# Patient Record
Sex: Male | Born: 1960 | Race: Black or African American | Hispanic: No | Marital: Married | Smoking: Never smoker
Health system: Southern US, Community
[De-identification: ages and names within clinical notes are randomized; demographics above are authoritative.]

## PROBLEM LIST (undated history)

## (undated) DIAGNOSIS — I5022 Chronic systolic (congestive) heart failure: Secondary | ICD-10-CM

## (undated) DIAGNOSIS — J45909 Unspecified asthma, uncomplicated: Secondary | ICD-10-CM

## (undated) DIAGNOSIS — E785 Hyperlipidemia, unspecified: Secondary | ICD-10-CM

## (undated) DIAGNOSIS — I10 Essential (primary) hypertension: Secondary | ICD-10-CM

## (undated) DIAGNOSIS — I428 Other cardiomyopathies: Secondary | ICD-10-CM

## (undated) HISTORY — DX: Essential (primary) hypertension: I10

## (undated) HISTORY — DX: Other cardiomyopathies: I42.8

---
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
## 2010-11-30 ENCOUNTER — Emergency Department (HOSPITAL_COMMUNITY): Payer: 59

## 2010-11-30 ENCOUNTER — Inpatient Hospital Stay (HOSPITAL_COMMUNITY)
Admission: EM | Admit: 2010-11-30 | Discharge: 2010-12-04 | DRG: 292 | Disposition: A | Discharge status: Home / Self Care | Payer: 59 | Attending: Cardiology | Admitting: Cardiology

## 2010-11-30 DIAGNOSIS — I428 Other cardiomyopathies: Secondary | ICD-10-CM | POA: Diagnosis present

## 2010-11-30 DIAGNOSIS — I5021 Acute systolic (congestive) heart failure: Principal | ICD-10-CM | POA: Diagnosis present

## 2010-11-30 DIAGNOSIS — J45909 Unspecified asthma, uncomplicated: Secondary | ICD-10-CM | POA: Diagnosis present

## 2010-11-30 DIAGNOSIS — I959 Hypotension, unspecified: Secondary | ICD-10-CM | POA: Diagnosis present

## 2010-11-30 DIAGNOSIS — N289 Disorder of kidney and ureter, unspecified: Secondary | ICD-10-CM | POA: Diagnosis present

## 2010-11-30 DIAGNOSIS — I509 Heart failure, unspecified: Secondary | ICD-10-CM | POA: Diagnosis present

## 2010-11-30 DIAGNOSIS — R0602 Shortness of breath: Secondary | ICD-10-CM

## 2010-11-30 DIAGNOSIS — I498 Other specified cardiac arrhythmias: Secondary | ICD-10-CM | POA: Diagnosis present

## 2010-11-30 LAB — URINALYSIS, ROUTINE W REFLEX MICROSCOPIC
Bilirubin Urine: NEGATIVE
Hgb urine dipstick: NEGATIVE
Protein, ur: NEGATIVE mg/dL
Urobilinogen, UA: 1 mg/dL (ref 0.0–1.0)

## 2010-11-30 LAB — CBC
MCH: 28.5 pg (ref 26.0–34.0)
MCHC: 33.7 g/dL (ref 30.0–36.0)
Platelets: 197 10*3/uL (ref 150–400)

## 2010-11-30 LAB — COMPREHENSIVE METABOLIC PANEL
ALT: 26 U/L (ref 0–53)
CO2: 24 mEq/L (ref 19–32)
Calcium: 9.6 mg/dL (ref 8.4–10.5)
Creatinine, Ser: 1.26 mg/dL (ref 0.50–1.35)
GFR calc Af Amer: >60 mL/min (ref >60)
GFR calc non Af Amer: >60 mL/min (ref >60)
Glucose, Bld: 102 mg/dL — ABNORMAL HIGH (ref 70–99)

## 2010-11-30 LAB — DIFFERENTIAL
Basophils Relative: 0 % (ref 0–1)
Eosinophils Absolute: 0 10*3/uL (ref 0.0–0.7)
Monocytes Absolute: 0.4 10*3/uL (ref 0.1–1.0)
Monocytes Relative: 6 % (ref 3–12)
Neutrophils Relative %: 72 % (ref 43–77)

## 2010-11-30 LAB — POCT I-STAT TROPONIN I: Troponin i, poc: 0.04 ng/mL (ref 0.00–0.08)

## 2010-11-30 MED ORDER — IOHEXOL 300 MG/ML  SOLN
100.0000 mL | Freq: Once | INTRAMUSCULAR | Status: AC | PRN
  Administered 2010-11-30: 100 mL via INTRAVENOUS

## 2010-12-01 LAB — CK TOTAL AND CKMB (NOT AT ARMC)
CK, MB: 5.5 ng/mL — ABNORMAL HIGH (ref 0.3–4.0)
Total CK: 305 U/L — ABNORMAL HIGH (ref 7–232)

## 2010-12-01 LAB — HEMOGLOBIN A1C
Hgb A1c MFr Bld: 5.2 % (ref <5.7)
Mean Plasma Glucose: 103 mg/dL (ref <117)

## 2010-12-01 LAB — LIPID PANEL
Cholesterol: 205 mg/dL — ABNORMAL HIGH (ref 0–200)
LDL Cholesterol: 145 mg/dL — ABNORMAL HIGH (ref 0–99)
VLDL: 27 mg/dL (ref 0–40)

## 2010-12-01 LAB — CARDIAC PANEL(CRET KIN+CKTOT+MB+TROPI)
CK, MB: 4.6 ng/mL — ABNORMAL HIGH (ref 0.3–4.0)
Relative Index: 1.6 (ref 0.0–2.5)
Total CK: 245 U/L — ABNORMAL HIGH (ref 7–232)
Troponin I: <0.3 ng/mL (ref <0.30)

## 2010-12-01 LAB — CBC
HCT: 41.5 % (ref 39.0–52.0)
MCH: 27.8 pg (ref 26.0–34.0)
MCV: 84.7 fL (ref 78.0–100.0)
Platelets: 181 10*3/uL (ref 150–400)
RBC: 4.9 MIL/uL (ref 4.22–5.81)

## 2010-12-01 LAB — BASIC METABOLIC PANEL
BUN: 17 mg/dL (ref 6–23)
CO2: 25 mEq/L (ref 19–32)
Calcium: 9.5 mg/dL (ref 8.4–10.5)
Creatinine, Ser: 1.39 mg/dL — ABNORMAL HIGH (ref 0.50–1.35)

## 2010-12-02 DIAGNOSIS — I5021 Acute systolic (congestive) heart failure: Secondary | ICD-10-CM

## 2010-12-02 LAB — BASIC METABOLIC PANEL
Chloride: 104 mEq/L (ref 96–112)
GFR calc Af Amer: 54 mL/min — ABNORMAL LOW (ref >60)
GFR calc non Af Amer: 44 mL/min — ABNORMAL LOW (ref >60)
Potassium: 3.5 mEq/L (ref 3.5–5.1)
Sodium: 143 mEq/L (ref 135–145)

## 2010-12-02 LAB — MAGNESIUM: Magnesium: 2.3 mg/dL (ref 1.5–2.5)

## 2010-12-03 ENCOUNTER — Inpatient Hospital Stay (HOSPITAL_COMMUNITY): Payer: 59

## 2010-12-03 DIAGNOSIS — I519 Heart disease, unspecified: Secondary | ICD-10-CM

## 2010-12-03 LAB — BASIC METABOLIC PANEL
Calcium: 9.4 mg/dL (ref 8.4–10.5)
GFR calc Af Amer: 59 mL/min — ABNORMAL LOW (ref >60)
GFR calc non Af Amer: 48 mL/min — ABNORMAL LOW (ref >60)
Potassium: 4.1 mEq/L (ref 3.5–5.1)
Sodium: 142 mEq/L (ref 135–145)

## 2010-12-04 DIAGNOSIS — I519 Heart disease, unspecified: Secondary | ICD-10-CM

## 2010-12-04 LAB — BASIC METABOLIC PANEL
BUN: 24 mg/dL — ABNORMAL HIGH (ref 6–23)
CO2: 27 mEq/L (ref 19–32)
Chloride: 107 mEq/L (ref 96–112)
Creatinine, Ser: 1.6 mg/dL — ABNORMAL HIGH (ref 0.50–1.35)
Glucose, Bld: 93 mg/dL (ref 70–99)

## 2010-12-04 LAB — HIV ANTIBODY (ROUTINE TESTING W REFLEX): HIV: NONREACTIVE

## 2010-12-04 LAB — IRON AND TIBC
Iron: 130 ug/dL (ref 42–135)
UIBC: 148 ug/dL (ref 125–400)

## 2010-12-06 LAB — PROTEIN ELECTROPHORESIS, SERUM
Alpha-1-Globulin: 5.1 % — ABNORMAL HIGH (ref 2.9–4.9)
Gamma Globulin: 12.3 % (ref 11.1–18.8)
Total Protein ELP: 6.8 g/dL (ref 6.0–8.3)

## 2010-12-10 ENCOUNTER — Ambulatory Visit (INDEPENDENT_AMBULATORY_CARE_PROVIDER_SITE_OTHER): Payer: 59 | Admitting: *Deleted

## 2010-12-10 DIAGNOSIS — I5022 Chronic systolic (congestive) heart failure: Secondary | ICD-10-CM

## 2010-12-10 DIAGNOSIS — R079 Chest pain, unspecified: Secondary | ICD-10-CM

## 2010-12-10 NOTE — Discharge Summary (Addendum)
NAMEDRESHAUN, STENE NO.:  [id number]  MEDICAL RECORD NO.:  [id number]  LOCATION:  4708                         FACILITY:  [facility]  PHYSICIAN:  Marca Ancona, MD      DATE OF BIRTH:  11-01-1960  DATE OF ADMISSION:  11/30/2010 DATE OF DISCHARGE:  12/04/2010                              DISCHARGE SUMMARY   DISCHARGE DIAGNOSES: 1. Newly diagnosed acute systolic heart failure secondary to     cardiomyopathy of uncertain etiology.     a.     Ejection fraction of 15% by echo December 03, 2010. B  Discharge weight of 81.1 kg. 1. Acute renal insufficiency, discharge creatinine 1.60.     a.     Outpatient BMET next week. 2. Borderline low blood pressure.  HOSPITAL COURSE:  Mr. Sturdevant is a 50 year old gentleman with no significant past medical history who was admitted with exertional dyspnea, for approximately 2 weeks with easy fatigability.  He was helping move some furniture on the day of admission and became extremely short of breath and had to sit down.  He presented to Urgent Care for further evaluation.  He denied any orthopnea or PND, but did endorse a weight gain of approximately 3-4 pounds with chronic cough on and off for few months.  In the Urgent Care, he was noted to have elevated BMP and chest x-ray was consistent with pulmonary edema.  D-dimer was also elevated, so a CT angiogram chest was obtained, which demonstrated no pulmonary embolism, but was consistent with CHF with bilateral small effusions and some edema.  His heart was enlarged.  His cardiac enlargement and imaging was concerning that the patient had acute systolic congestive heart failure, with no particular cause that was certain.  The patient did not have a history of uncontrolled blood pressure and did not drink or use drugs.  He has not had any chest pain. Dr. Shirlee Latch stated that he cannot rule out myocarditis as a possible cause and coronary artery disease was also a possibility.  His  EKG could be consistent with an old anteroseptal MI.  He was started on aspirin and initiated on 40 mg of IV Lasix b.i.d. with afterload reduction with an ACE inhibitor.  Cardiac enzymes were cycled, which did demonstrate some elevation in MB of 5.5 and CK of 305 and negative relative indices and negative troponins x3.  With diuresis, the patient improved. However, his creatinine went from 1.26 up to a peak 1.65.  Lasix was backed down and on the day of discharge, it was 1.60.  A 2-D echocardiogram was obtained this admission demonstrating a severely decreased EF of 15% with akinesis of inferior, lateral, and anterior septal myocardium defect.  There is moderate to severe hypokinesis of the septum in the anterolateral segments.  Possibility of a new thrombus versus coarse trabeculations.  Because of concern for LV thrombus, a limited 2-D echocardiogram was obtained and read by Dr. Myrtis Ser this afternoon and reviewed with Dr. Shirlee Latch, which showed no obvious apical LV clot.  The images on previous echocardiogram were felt consistent with trabecula.  On day of discharge, the patient is feeling better.  He has been initiated on a regimen  of ACE inhibitor, beta blockade, aspirin, Lasix, and potassium.  Dr. Shirlee Latch has seen and examined him today and feels he is stable for discharge.  The patient also had brief 12 beats of nonsustained VT this admission. It was felt that he will require cardiac catheterization at some point, however this will be deferred until his renal function improved given recent increase with diuresis in his creatinine.  DISCHARGE LABS:  WBC 6.9, hemoglobin 13.6, hematocrit 41.5, platelet count 181.  D-dimer 1.17.  Sodium 143, potassium 3.5, chloride 104, CO2 of 29, glucose 94, BUN 21, creatinine 1.5.  LFTs were normal on August 31.  Peak CK was 305, peak MB was 5.5, negative troponins x3.  BNP on admit was 925, at discharge 218.  Total cholesterol of 205, triglycerides  135, HDL 33, LDL 145.  TSH was 2.214.  UA was negative.  STUDIES: 1. Chest x-ray December 03, 2010, showed improving congestive heart     failure without complete resolution. 2. CTA angio of the chest November 30, 2010, showed no evidence of     significant PE.  Past symptomatic congestion.  Small bilateral     pleural effusions with basilar edema.  Changes most consistent with     congestive failure or fluid overload. 3. 2-D echocardiogram, December 04, 2010, please see above for     impression, as well as December 03, 2010.  DISCHARGE MEDICATIONS: 1. Aspirin 81 mg daily. 2. Coreg 2.25 mg t.i.d. 3. Lasix 20 mg daily. 4. Lisinopril 10 mg daily. 5. Potassium chloride 10 mEq daily.  DISPOSITION:  Mr. Woolum will be discharged in stable condition to home.  He is to increase his activity slowly and follow a low-salt heart- healthy diet.  Some of his lab tests including SPEP and HIV are still pending at the time of dictation.  The patient was instructed to contact Dr. Alford Highland office in several days for these results.  He will have lab work on December 10, 2010 at 11:30 a.m. including a BMET to assess his renal function given his renal insufficiency.  He will follow up with Dr. Shirlee Latch on December 12, 2010 at 11:45 a.m.Marland Kitchen  Per Dr. Shirlee Latch, he is okay to return to work on December 10, 2010, if he feels okay.  He will also need a catheterization at some point to rule out ischemia as a contributing factor to his cardiomyopathy, but this will be arranged as an outpatient pending stability of his renal function.  DURATION OF DISCHARGE ENCOUNTER:  Greater than 30 minutes including physician PA time.     Ronie Spies, P.A.C.   ______________________________ Marca Ancona, MD    DD/MEDQ  D:  12/04/2010  T:  12/04/2010  Job:  [id number]  Electronically Signed by Ronie Spies  on 12/10/2010 07:36:46 PM Electronically Signed by Marca Ancona MD on 12/15/2010 11:21:02 PM

## 2010-12-11 ENCOUNTER — Encounter: Payer: Self-pay | Admitting: Cardiology

## 2010-12-11 LAB — BASIC METABOLIC PANEL
CO2: 30 mEq/L (ref 19–32)
Calcium: 9.4 mg/dL (ref 8.4–10.5)
GFR: 56.12 mL/min — ABNORMAL LOW (ref >60.00)
Sodium: 143 mEq/L (ref 135–145)

## 2010-12-12 ENCOUNTER — Encounter: Payer: Self-pay | Admitting: Cardiology

## 2010-12-12 ENCOUNTER — Encounter: Payer: Self-pay | Admitting: *Deleted

## 2010-12-12 ENCOUNTER — Telehealth: Payer: Self-pay | Admitting: *Deleted

## 2010-12-12 ENCOUNTER — Ambulatory Visit (INDEPENDENT_AMBULATORY_CARE_PROVIDER_SITE_OTHER): Payer: 59 | Admitting: Cardiology

## 2010-12-12 DIAGNOSIS — I509 Heart failure, unspecified: Secondary | ICD-10-CM

## 2010-12-12 DIAGNOSIS — E7800 Pure hypercholesterolemia, unspecified: Secondary | ICD-10-CM

## 2010-12-12 DIAGNOSIS — E785 Hyperlipidemia, unspecified: Secondary | ICD-10-CM

## 2010-12-12 DIAGNOSIS — I5022 Chronic systolic (congestive) heart failure: Secondary | ICD-10-CM

## 2010-12-12 LAB — CBC WITH DIFFERENTIAL/PLATELET
Basophils Relative: 0.3 % (ref 0.0–3.0)
Eosinophils Relative: 1.5 % (ref 0.0–5.0)
HCT: 41.7 % (ref 39.0–52.0)
Hemoglobin: 13.4 g/dL (ref 13.0–17.0)
Lymphs Abs: 2.1 10*3/uL (ref 0.7–4.0)
Monocytes Relative: 8.8 % (ref 3.0–12.0)
Neutro Abs: 2.6 10*3/uL (ref 1.4–7.7)
WBC: 5.3 10*3/uL (ref 4.5–10.5)

## 2010-12-12 LAB — BASIC METABOLIC PANEL
BUN: 22 mg/dL (ref 6–23)
Chloride: 107 mEq/L (ref 96–112)
Potassium: 4.4 mEq/L (ref 3.5–5.1)

## 2010-12-12 LAB — BRAIN NATRIURETIC PEPTIDE: Pro B Natriuretic peptide (BNP): 83 pg/mL (ref 0.0–100.0)

## 2010-12-12 MED ORDER — ATORVASTATIN CALCIUM 20 MG PO TABS: 20.0000 mg | ORAL_TABLET | Freq: Every day | ORAL | Status: DC

## 2010-12-12 MED ORDER — CARVEDILOL 12.5 MG PO TABS: 12.5000 mg | ORAL_TABLET | Freq: Two times a day (BID) | ORAL | Status: DC

## 2010-12-12 MED ORDER — SPIRONOLACTONE 25 MG PO TABS: ORAL_TABLET | ORAL | Status: DC

## 2010-12-12 NOTE — Assessment & Plan Note (Addendum)
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  Multiple wall motion abnormalities.  He was admitted with CHF symptoms but never had chest pain.  ECG has widespread T wave inversions and Qs anteroseptally.  Cardiac enzymes were negative in the hospital.  It is possible that he had an MI 2-3 weeks before admission, based on the onset of his CHF-type symptoms.  However, it is also possible that he has some other cause for cardiomyopathy such as viral myocarditis.  His family history could be consistent with either a familial predisposition to CAD or to cardiomoypathy.  No history of HTN, drug use, or ETOH.  HIV and SPEP were negative.  Symptoms are NYHA class I - II currently and he does not appear volume overloaded.  - I am going to arrange for cardiac cath to assess for CAD.  - He can decrease Lasix and KCl to every other day.  - Will increase Coreg to 12.5 mg bid - Start spironolactone 12.5 mg daily. - BMET today and in 2 weeks.  - If cath shows no coronary disease, next step will be cardiac MRI.

## 2010-12-12 NOTE — Patient Instructions (Addendum)
Increase Coreg (carvedilol) to 9.375mg  twice a day for 5 days. This will be one and one-half 6.25mg  tablets twice a day for 5 days. After 5 days increase Coreg(carvediolol) to 12.5mg  twice a day.   Start spironolactone 12.5mg  daily. This will be one-half spironolactone 25mg  tablet daily.  Start Lipitor 20mg  daily.  Decrease Lasix(furosemide) to 20mg  every other day.  Decrease KCL(potasssium) to 10 mEq every other day.  Lab today--BMP/BNP/CBC/PT  428.22   272.0  Your physician has requested that you have a cardiac catheterization. Cardiac catheterization is used to diagnose and/or treat various heart conditions. Doctors may recommend this procedure for a number of different reasons. The most common reason is to evaluate chest pain. Chest pain can be a symptom of coronary artery disease (CAD), and cardiac catheterization can show whether plaque is narrowing or blocking your heart's arteries. This procedure is also used to evaluate the valves, as well as measure the blood flow and oxygen levels in different parts of your heart. For further information please visit [email or web address] Please follow instruction sheet, as given.  This is scheduled for 12/14/10.  Your physician recommends that you schedule a follow-up appointment with Dr Shirlee Latch  about 2 weeks after the cath scheduled for 12/14/10.   Luana Shu [phone]

## 2010-12-12 NOTE — Progress Notes (Signed)
50 yo with minimal past history presents after hospitalization with acute systolic CHF to establish outpatient cardiology followup.  About 3-4 weeks ago, patient began to notice dyspnea with heavy exertion, such as when he carried something up a flight of steps. He also was having difficulty sleeping with symptoms that sounded consistent with orthopnea.  No chest pain.  He went to urgent care for evaluation, and CXR showed pulmonary edema.  BNP was elevated and ECG was abnormal.  He was sent to the ER.  Evaluation showed CHF with volume overload.  Echo showed EF 15% with moderate to severe LV dilation and multiple wall motion abnormalities.  Cardiac enzymes were negative.  Patient was diuresed and started on cardiac meds.  He follows up in the office today for further evaluation.   Since discharge, he seems to be doing well.  He has a mild chronic cough that he had before his CHF hospitalization.  He denies exertional dyspnea now though he has not been very active.  No orthopnea/PND.  He weighs daily and weight has been stable.  No chest pain.   ECG: NSR, inferior and anterior T wave inversions, possible old ASMI.    Labs (9/12): K 4.3, creatinine 1.7, pro-BNP 907=>218, SPEP negative, HIV negative, transferrin saturation 47%, LDL 145, HDL 33  PMH: 1. Childhood asthma 2. Hyperlipidemia 3. Systolic CHF: Echo (9/12) with EF 15%, moderate-severe LV dilation, inferior/posterior/inferoseptal/apical akinesis, septal/anterolateral severe hypokinesis, mild to moderate RV dilation with normal RV systolic function.  HIV negative, SPEP negative.  No history of ETOH or drugs.  No history of HTN.   FH: Father with CHF in his 45s, thinks he had an MI.  Father has PCM.  Father had 9 brothers, all had MIs or CHF.  Sister with MI in her 2s.   SH: Lives in [place] with his wife.  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  No tobacco, no ETOH, no no drugs.   ROS: All systems reviewed and  negative except as per HPI.   Current Outpatient Prescriptions  Medication Sig Dispense Refill  . aspirin 81 MG tablet Take 81 mg by mouth daily.        Marland Kitchen lisinopril (PRINIVIL,ZESTRIL) 10 MG tablet Take 10 mg by mouth daily.        Marland Kitchen DISCONTD: Carvedilol (COREG PO) Take 6.25 mg by mouth 2 (two) times daily.       Marland Kitchen DISCONTD: Furosemide (LASIX PO) Take 20 mg by mouth daily.        Marland Kitchen DISCONTD: potassium chloride (KLOR-CON) 10 MEQ CR tablet Take 10 mEq by mouth daily.        Marland Kitchen atorvastatin (LIPITOR) 20 MG tablet Take 1 tablet (20 mg total) by mouth daily.  30 tablet  3  . carvedilol (COREG) 12.5 MG tablet Take 1 tablet (12.5 mg total) by mouth 2 (two) times daily.  60 tablet  6  . furosemide (LASIX) 20 MG tablet Take one every other day      . potassium chloride (KLOR-CON 10) 10 MEQ CR tablet Take one every other day      . spironolactone (ALDACTONE) 25 MG tablet Take one-half tablet daily  15 tablet  6    BP 120/88  Pulse 76  Ht 5\' 8"  (1.727 m)  Wt 182 lb (82.555 kg)  BMI 27.67 kg/m2 General: NAD Neck: No JVD, no thyromegaly or thyroid nodule.  Lungs: Clear to auscultation bilaterally with normal respiratory effort. CV: Nondisplaced  PMI.  Heart regular S1/S2, no S3/S4, no murmur.  No peripheral edema.  No carotid bruit.  Normal pedal pulses.  Abdomen: Soft, nontender, no hepatosplenomegaly, no distention.  Skin: Intact without lesions or rashes.  Neurologic: Alert and oriented x 3.  Psych: Normal affect. Extremities: No clubbing or cyanosis.  HEENT: Normal.

## 2010-12-12 NOTE — Assessment & Plan Note (Signed)
Elevated LDL, concern for possible CAD.  Will start atorvastatin 20 mg daily.

## 2010-12-12 NOTE — Telephone Encounter (Signed)
Dr Shirlee Latch wanted pt to hold Lasix until after cath on 9/1/412. LMTCB for pt.

## 2010-12-13 ENCOUNTER — Telehealth: Payer: Self-pay | Admitting: *Deleted

## 2010-12-13 NOTE — Telephone Encounter (Signed)
Dr Shirlee Latch wants Pt to hold Lasix until Cath. RN left message to call back.

## 2010-12-13 NOTE — Telephone Encounter (Signed)
RN s/w Pt re: Lab results. Pt states that he believes he may have taken it already for this morning. Pt does verbalize understanding to stop taking Lasix until further notified. Sharmon Revere, RN

## 2010-12-13 NOTE — Progress Notes (Signed)
Quick Note:  RN left message to call back [id number]. Sharmon Revere, RN   ______

## 2010-12-14 ENCOUNTER — Inpatient Hospital Stay (HOSPITAL_BASED_OUTPATIENT_CLINIC_OR_DEPARTMENT_OTHER)
Admission: RE | Admit: 2010-12-14 | Discharge: 2010-12-14 | Disposition: A | Discharge status: Home / Self Care | Payer: 59 | Source: Ambulatory Visit | Attending: Cardiology | Admitting: Cardiology

## 2010-12-14 DIAGNOSIS — I428 Other cardiomyopathies: Secondary | ICD-10-CM

## 2010-12-14 NOTE — Telephone Encounter (Signed)
Cath today

## 2010-12-15 NOTE — H&P (Signed)
Edward Greene, [name]:  [id number]  MEDICAL RECORD NO.:  [id number]  LOCATION:  [facility]                         FACILITY:  [facility]  PHYSICIAN:  Marca Ancona, MD      DATE OF BIRTH:  03-Apr-1960  DATE OF ADMISSION:  11/30/2010 DATE OF DISCHARGE:                             HISTORY & PHYSICAL   HISTORY OF PRESENT ILLNESS:  This is a 50 year old with no significant past medical history who was admitted with exertional shortness of breath.  The patient reports some 2 weeks of exertional dyspnea.  He has noted easy fatiguability as well as shortness of breath with climbing stairs with that.  Today, he was helping move some furniture and became extremely short of breath and had to sit down.  He was concerned by this and decided to go to Urgent Care for evaluation.  He denies any orthopnea or paroxysmal nocturnal dyspnea.  He does think he has gained about 3-4 pounds recently.  He has had a chronic cough on and off for about 2 months.  He has had no chest pain or tightness.  He has had no palpitations.  In Urgent Care, he was noted to have elevated BNP and his chest x-ray was consistent with pulmonary edema.  He had a D-dimer done that was elevated, so he had a CT angiogram of his chest.  This showed no pulmonary embolus, but was consistent with congestive heart failure with small bilateral effusions and some edema and the heart was enlarged.  MEDICATIONS:  No medications.  PAST MEDICAL HISTORY:  The patient has history of childhood asthma.  SOCIAL HISTORY:  The patient lives in [place] with his wife.  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  He is also Architect.  He does not smoke, does not drink alcohol, does not use any drugs.  FAMILY HISTORY:  Mother had cancer.  Father had a pacemaker and died of congestive heart failure in the 102s.  His siblings have hypertension.  REVIEW OF SYSTEMS:  All systems were reviewed and were negative except as noted in  history present illness.  PHYSICAL EXAMINATION:  VITAL SIGNS:  The patient is afebrile, pulse is initially in the 110s and regular, blood pressure is 140/102 initially, has been in the 100s to 110s since he has been in the emergency department systolic and with no intervention. GENERAL:  This is a well-developed male in no apparent distress. HEENT:  Normal exam. ABDOMEN:  Soft, nontender.  No hepatosplenomegaly.  Normal bowel sounds. EXTREMITIES:  No clubbing or cyanosis. NECK:  There is no thyromegaly or thyroid nodule.  JVP is mildly elevated at 8-9 cm of water. CARDIOVASCULAR:  Heart regular, S1, S2.  I believe that I can hear soft S3.  There is no S4.  There is no murmur.  There are 2+ posterior tibial pulses bilaterally.  There is no peripheral edema. LUNGS:  There are crackles at the bases bilaterally. MUSCULOSKELETAL:  Normal exam. NEUROLOGIC:  Alert and oriented x3.  Normal affect. SKIN:  Normal exam.  RADIOLOGY:  CT angiogram of the chest shows no PE.  The heart is enlarged.  There are bilateral small effusions and pulmonary edema.  Chest x-ray shows mild pulmonary edema with bilateral small effusions. EKG shows normal sinus rhythm at rate of 108.  There are Qs in V1 and V2 suggestive of possible old anteroseptal MI.  There are anterolateral T- wave inversions as well.  White count 5.9, hematocrit 41.2, platelets 197, potassium 3.8, creatinine 1.26.  LFTs normal.  D-dimer was elevated.  Pro BNP was 907, initial troponin 0.04.  IMPRESSION:  This is a 50 year old with no significant past medical history presents with symptoms on exam consistent with congestive heart failure.  The patient's cardiac enlargement on imaging that is concerning for the cardiac enlargement that the patient has on imaging is concerning for acute systolic congestive heart failure.  The cause of the heart failure is uncertain.  The patient has no history of uncontrolled blood pressure.  He does not  drink or use drugs.  He has not had any chest pain.  We cannot rule out myocarditis as a possible cause of his congestive heart failure, also coronary artery disease is always possible, especially, his EKG could be consistent with an old anterior septal MI.  We will plan to get an echo in the morning.  We will start him on aspirin.  We will also begin diuresis with Lasix 40 mg IV b.i.d.  He will get afterload reduction with enalapril 2.5 mg b.i.d. We will cycle his cardiac enzymes and further plan will hinge on the results of his echocardiogram tomorrow.     Marca Ancona, MD     DM/MEDQ  D:  12/01/2010  T:  12/01/2010  Job:  [id number]  Electronically Signed by Marca Ancona MD on 12/15/2010 11:21:08 PM

## 2010-12-19 ENCOUNTER — Encounter: Payer: Self-pay | Admitting: Internal Medicine

## 2010-12-19 ENCOUNTER — Encounter: Payer: Self-pay | Admitting: *Deleted

## 2010-12-19 ENCOUNTER — Other Ambulatory Visit: Payer: Self-pay | Admitting: *Deleted

## 2010-12-27 ENCOUNTER — Encounter: Payer: Self-pay | Admitting: Cardiology

## 2010-12-30 NOTE — Cardiovascular Report (Signed)
  Edward Greene, Edward Greene NO.:  [id number]  MEDICAL RECORD NO.:  [id number]  LOCATION:                                 FACILITY:  PHYSICIAN:  Marca Ancona, MD      DATE OF BIRTH:  May 18, 1960  DATE OF PROCEDURE:  12/14/2010 DATE OF DISCHARGE:                           CARDIAC CATHETERIZATION   PROCEDURES: 1. Left heart catheterization. 2. Coronary angiography.  INDICATIONS:  This is a 49 year old with a history of new-onset systolic congestive heart failure.  He is found to have severely depressed LV ejection fraction by echo.  He has an abnormal EKG that could be suggestive of coronary artery disease.  This left heart catheterization was done to assess for coronary artery disease as a cause of his cardiomyopathy.  PROCEDURE NOTE:  Informed consent was obtained from the patient.  With Allen's testing on his right wrist, the right ulnar artery gave good collateral circulation of the radial side of the hand constituting a positive Allen test.  The right radial area was then sterilely prepped and draped.  Lidocaine 1% was used to locally anesthetize the right radial area.  The right radial arteries entered using modified Seldinger technique and a 5-French arterial sheath was placed.  The patient received 3 mg intraarterial verapamil, 4000 units of IV heparin.  The right coronary artery was engaged with the JR-4 catheter and left coronary artery was engaged using the JL-3.5 catheter.  Left ventricle was entered using angled pigtail catheter.  Left ventriculography was not done due to chronic kidney disease.  There were no complications.  FINDINGS: 1. Hemodynamics, LV 91/23, aorta 94/67. 2. Left ventriculography.  Left ventriculography was not done due to     elevated creatinine.  We did of note only use 35 mL of contrast     for the study. 3. Right coronary artery.  The right coronary artery was a relatively     small vessel, was codominant with the left  system.  There is no     angiographic coronary artery disease. 4. Left main.  The left main was a relatively long vessel with no     angiographic coronary artery disease. 5. Left circumflex system.  There is no angiographic coronary artery     disease of the left circumflex system. 6. LAD system.  The LAD was large, wrapped around the apex.  There is     no angiographic coronary artery disease.  IMPRESSION:  There is no angiographic coronary artery disease.  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  We will get a cardiac MRI to further assess because of his cardiomyopathy.     Marca Ancona, MD     DM/MEDQ  D:  12/14/2010  T:  12/14/2010  Job:  [id number]  Electronically Signed by Marca Ancona MD on 12/30/2010 11:22:33 PM

## 2010-12-31 ENCOUNTER — Encounter: Payer: Self-pay | Admitting: Cardiology

## 2010-12-31 ENCOUNTER — Ambulatory Visit (INDEPENDENT_AMBULATORY_CARE_PROVIDER_SITE_OTHER): Payer: 59 | Admitting: Cardiology

## 2010-12-31 VITALS — BP 128/92 | HR 88 | Ht 67.0 in | Wt 181.0 lb

## 2010-12-31 DIAGNOSIS — I509 Heart failure, unspecified: Secondary | ICD-10-CM

## 2010-12-31 DIAGNOSIS — I5022 Chronic systolic (congestive) heart failure: Secondary | ICD-10-CM

## 2010-12-31 DIAGNOSIS — E785 Hyperlipidemia, unspecified: Secondary | ICD-10-CM

## 2010-12-31 MED ORDER — VALSARTAN 80 MG PO TABS: 80.0000 mg | ORAL_TABLET | Freq: Two times a day (BID) | ORAL | Status: DC

## 2010-12-31 NOTE — Patient Instructions (Signed)
Stop lasix(furosemide), KCL(potassium), aspirin, and lisinopril.  Start valsartan 80mg  twice a day.  Your physician recommends that you return for lab work in: 2 weeks--BMP/BNP 428.22   Your physician recommends that you return for a FASTING lipid profile: in 1 month when you see Dr Shirlee Latch. [phone]    Your physician recommends that you schedule a follow-up appointment in: 1 month with Dr Shirlee Latch.

## 2011-01-01 NOTE — Assessment & Plan Note (Signed)
Continue Lipitor.  Lipids/LFTs in 1 more month (at followup).

## 2011-01-01 NOTE — Assessment & Plan Note (Addendum)
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  Left heart cath showed no angiographic CAD. No history of HTN, drug use, or ETOH.  HIV and SPEP were negative.  Symptoms are NYHA class I - II currently and he does not appear volume overloaded. It is possible that his cardiomyopathy is from viral myocarditis.  - I am going to arrange for a cardiac MRI to assess for evidence of myocarditis or infiltrative disease.   - He can stop Lasix and KCl for now.  - He can stop aspirin given no coronary disease as it could potentially decrease ACEI effects.  - Stop lisinopril due to chronic cough and start valsartan 80 mg bid.  - Continue Coreg and spironolactone at current doses.  - BMET/BNP in 2 weeks.  - Followup in 1 month.  - Echo after 6 months of medical therapy.

## 2011-01-01 NOTE — Progress Notes (Addendum)
50 yo with minimal past history presented initially after hospitalization with acute systolic CHF.  About 2 months ago, patient began to notice dyspnea with heavy exertion, such as when he carried something up a flight of steps. He also was having difficulty sleeping with symptoms that sounded consistent with orthopnea.  No chest pain.  He went to urgent care for evaluation, and CXR showed pulmonary edema.  BNP was elevated and ECG was abnormal.  He was sent to the ER.  Evaluation showed CHF with volume overload.  Echo showed EF 15% with moderate to severe LV dilation and multiple wall motion abnormalities.  Cardiac enzymes were negative.  Patient was diuresed and started on cardiac meds.  Left heart cath in 9/12 showed no angiographic CAD.   Patient has been doing well recently.  No exertional dyspnea.  He is doing some jogging with no problems.  No orthopnea or PND.  He has developed a chronic cough since starting lisinopril.    Labs (9/12): K 4.3, creatinine 1.7 => 1.6, pro-BNP 907=>218, SPEP negative, HIV negative, transferrin saturation 47%, LDL 145, HDL 33  PMH: 1. Childhood asthma 2. Hyperlipidemia 3. Systolic CHF: Echo (9/12) with EF 15%, moderate-severe LV dilation, inferior/posterior/inferoseptal/apical akinesis, septal/anterolateral severe hypokinesis, mild to moderate RV dilation with normal RV systolic function.  HIV negative, SPEP negative.  No history of ETOH or drugs.  No history of HTN.  LHC (9/12) with no angiographic CAD. Possible viral myocarditis.   FH: Father with CHF in his 29s, thinks he had an MI.  Father has PCM.  Father had 9 brothers, all had MIs or CHF.  Sister with MI in her 73s.   SH: Lives in [place] with his wife.  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  No tobacco, no ETOH, no no drugs.   ROS: All systems reviewed and negative except as per HPI.   Current Outpatient Prescriptions  Medication Sig Dispense Refill  . atorvastatin  (LIPITOR) 20 MG tablet Take 1 tablet (20 mg total) by mouth daily.  30 tablet  3  . carvedilol (COREG) 12.5 MG tablet Take 1 tablet (12.5 mg total) by mouth 2 (two) times daily.  60 tablet  6  . spironolactone (ALDACTONE) 25 MG tablet Take one-half tablet daily  15 tablet  6  . valsartan (DIOVAN) 80 MG tablet Take 1 tablet (80 mg total) by mouth 2 (two) times daily.  60 tablet  6    BP 128/92  Pulse 88  Ht 5\' 7"  (1.702 m)  Wt 181 lb (82.101 kg)  BMI 28.35 kg/m2 General: NAD Neck: No JVD, no thyromegaly or thyroid nodule.  Lungs: Clear to auscultation bilaterally with normal respiratory effort. CV: Nondisplaced PMI.  Heart regular S1/S2, no S3/S4, no murmur.  No peripheral edema.  No carotid bruit.  Normal pedal pulses.  Abdomen: Soft, nontender, no hepatosplenomegaly, no distention.  Skin: Intact without lesions or rashes.  Neurologic: Alert and oriented x 3.  Psych: Normal affect. Extremities: No clubbing or cyanosis.  HEENT: Normal.

## 2011-01-02 ENCOUNTER — Ambulatory Visit (HOSPITAL_COMMUNITY)
Admission: RE | Admit: 2011-01-02 | Discharge: 2011-01-02 | Disposition: A | Discharge status: Home / Self Care | Payer: 59 | Source: Ambulatory Visit | Attending: Cardiology | Admitting: Cardiology

## 2011-01-02 ENCOUNTER — Encounter: Payer: Self-pay | Admitting: *Deleted

## 2011-01-02 DIAGNOSIS — I428 Other cardiomyopathies: Secondary | ICD-10-CM

## 2011-01-02 DIAGNOSIS — I517 Cardiomegaly: Secondary | ICD-10-CM | POA: Insufficient documentation

## 2011-01-02 LAB — BASIC METABOLIC PANEL
BUN: 19 mg/dL (ref 6–23)
Chloride: 106 mEq/L (ref 96–112)
Glucose, Bld: 103 mg/dL — ABNORMAL HIGH (ref 70–99)
Potassium: 4 mEq/L (ref 3.5–5.1)

## 2011-01-02 MED ORDER — GADOBENATE DIMEGLUMINE 529 MG/ML IV SOLN
20.0000 mL | Freq: Once | INTRAVENOUS | Status: AC
  Administered 2011-01-02: 20 mL via INTRAVENOUS

## 2011-01-16 ENCOUNTER — Other Ambulatory Visit (INDEPENDENT_AMBULATORY_CARE_PROVIDER_SITE_OTHER): Payer: 59 | Admitting: *Deleted

## 2011-01-16 DIAGNOSIS — I5022 Chronic systolic (congestive) heart failure: Secondary | ICD-10-CM

## 2011-01-16 LAB — BASIC METABOLIC PANEL
BUN: 13 mg/dL (ref 6–23)
CO2: 26 mEq/L (ref 19–32)
Calcium: 9.4 mg/dL (ref 8.4–10.5)
Creatinine, Ser: 1.4 mg/dL (ref 0.4–1.5)

## 2011-01-16 LAB — LIPID PANEL
HDL: 37.6 mg/dL — ABNORMAL LOW (ref >39.00)
Total CHOL/HDL Ratio: 3
VLDL: 21 mg/dL (ref 0.0–40.0)

## 2011-01-16 LAB — BRAIN NATRIURETIC PEPTIDE: Pro B Natriuretic peptide (BNP): 81 pg/mL (ref 0.0–100.0)

## 2011-02-06 ENCOUNTER — Other Ambulatory Visit: Payer: 59 | Admitting: *Deleted

## 2011-02-06 ENCOUNTER — Encounter: Payer: Self-pay | Admitting: Cardiology

## 2011-02-06 ENCOUNTER — Ambulatory Visit (INDEPENDENT_AMBULATORY_CARE_PROVIDER_SITE_OTHER): Payer: 59 | Admitting: Cardiology

## 2011-02-06 DIAGNOSIS — E785 Hyperlipidemia, unspecified: Secondary | ICD-10-CM

## 2011-02-06 DIAGNOSIS — I509 Heart failure, unspecified: Secondary | ICD-10-CM

## 2011-02-06 DIAGNOSIS — I5022 Chronic systolic (congestive) heart failure: Secondary | ICD-10-CM

## 2011-02-06 MED ORDER — CARVEDILOL 12.5 MG PO TABS: ORAL_TABLET | ORAL | Status: DC

## 2011-02-06 MED ORDER — SPIRONOLACTONE 25 MG PO TABS: 25.0000 mg | ORAL_TABLET | Freq: Every day | ORAL | Status: DC

## 2011-02-06 NOTE — Patient Instructions (Addendum)
Do not take lisinopril or lasix(furosemide).  You should be taking valsartan(Diovan).  Increase coreg(carvedilol)  to 18.75mg  twice a day. This will be one and one-half 12.5mg  tablets twice a day.  Increase spironolactone to 25mg  daily.  Your physician recommends that you return for a FASTING lipid profile /liver profile/ BMET in 2 weeks--428.22--you can cancel the lab appointment scheduled for today.  Schedule an appointment with Sande Rives or Amy Clegg,NP in the Heart Failure Clinic at the [facility] in 1 month.  Your physician recommends that you schedule a follow-up appointment in: 3 months with Dr Shirlee Latch.

## 2011-02-07 NOTE — Progress Notes (Signed)
50 yo with minimal past history presented initially after hospitalization with acute systolic CHF.  About 2 months ago, patient began to notice dyspnea with heavy exertion, such as when he carried something up a flight of steps. He also was having difficulty sleeping with symptoms that sounded consistent with orthopnea.  No chest pain.  He went to urgent care for evaluation, and CXR showed pulmonary edema.  BNP was elevated and ECG was abnormal.  He was sent to the ER.  Evaluation showed CHF with volume overload.  Echo showed EF 15% with moderate to severe LV dilation and multiple wall motion abnormalities.  Cardiac enzymes were negative.  Patient was diuresed and started on cardiac meds.  Left heart cath in 9/12 showed no angiographic CAD.  Cardiac MRI (9/12) showed EF 15%, moderate to severe LV dilation, mild RV dysfunction, and no delayed enhancement.    Patient has been doing well recently.  No exertional dyspnea.  He is walking for 30-40 minutes daily and doing some jogging without problems.  He had some erectile dysfunction initially with the cardiac meds but this has improved.  No orthopnea or PND.  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    Labs (9/12): K 4.3, creatinine 1.7 => 1.6, pro-BNP 907=>218, SPEP negative, HIV negative, transferrin saturation 47%, LDL 145, HDL 33 Labs (10/12): K 3.5, creatinine 1.4, LDL 55, HDL 38, BNP 81  PMH: 1. Childhood asthma 2. Hyperlipidemia 3. Systolic CHF: Echo (9/12) with EF 15%, moderate-severe LV dilation, inferior/posterior/inferoseptal/apical akinesis, septal/anterolateral severe hypokinesis, mild to moderate RV dilation with normal RV systolic function.  HIV negative, SPEP negative.  No history of ETOH or drugs.  No history of HTN.  LHC (9/12) with no angiographic CAD. Possible viral myocarditis versus familial cardiomyopathy.  Cardiac MRI (9/12) with EF 15%, moderate to severe LV dilation, mild RV dysfunction, no myocardial  delayed enhancement.   4. ACEI cough  FH: Father with CHF in his 69s, thinks he had an MI.  Father has PCM.  Father had 9 brothers, all had MIs or CHF.  Sister with MI in her 40s.   SH: Lives in [place] with his wife.  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  No tobacco, no ETOH, no no drugs.   ROS: All systems reviewed and negative except as per HPI.   Current Outpatient Prescriptions  Medication Sig Dispense Refill  . atorvastatin (LIPITOR) 20 MG tablet Take 1 tablet (20 mg total) by mouth daily.  30 tablet  3  . valsartan (DIOVAN) 80 MG tablet Take 1 tablet (80 mg total) by mouth 2 (two) times daily.  60 tablet  6  . carvedilol (COREG) 12.5 MG tablet Take one and one-half tablets twice a day  90 tablet  6  . spironolactone (ALDACTONE) 25 MG tablet Take 1 tablet (25 mg total) by mouth daily.  30 tablet  6    BP 124/86  Pulse 88  Resp 18  Ht 5\' 7"  (1.702 m)  Wt 79.742 kg (175 lb 12.8 oz)  BMI 27.53 kg/m2  SpO2 96% General: NAD Neck: No JVD, no thyromegaly or thyroid nodule.  Lungs: Clear to auscultation bilaterally with normal respiratory effort. CV: Nondisplaced PMI.  Heart regular S1/S2, no S3/S4, no murmur.  No peripheral edema.  No carotid bruit.  Normal pedal pulses.  Abdomen: Soft, nontender, no hepatosplenomegaly, no distention.  Skin: Intact without lesions or rashes.  Neurologic: Alert and oriented x 3.  Psych: Normal affect. Extremities: No clubbing or cyanosis.  HEENT: Normal.

## 2011-02-07 NOTE — Assessment & Plan Note (Addendum)
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  Left heart cath showed no angiographic CAD. No history of HTN, drug use, or ETOH.  HIV and SPEP were negative.  Symptoms are NYHA class I - II currently and he does not appear volume overloaded. Cardiac MRI showed no myocardial delayed enhancement.  It is possible that his cardiomyopathy is from viral myocarditis versus a familial cardiomyopathy.  He knows that he has had multiple relatives with heart failure but does not have much detail about this.  Most of them have seemed to be older.  - Increase Coreg to 18.75 mg bid today.  - Increase spironolactone to 25 mg daily.   - Increase valsartan at next appointment.  Next step after that will be Bidil.   - Echo in 3/13, if EF still low will need ICD.   - BMET in 2 wks.   - I am going to have him followup in CHF clinic in 1 month for med titration then will see me in 3 months.

## 2011-02-07 NOTE — Assessment & Plan Note (Signed)
Excellent lipids on statin.  Continue.

## 2011-02-18 ENCOUNTER — Other Ambulatory Visit (INDEPENDENT_AMBULATORY_CARE_PROVIDER_SITE_OTHER): Payer: 59 | Admitting: *Deleted

## 2011-02-18 DIAGNOSIS — E7800 Pure hypercholesterolemia, unspecified: Secondary | ICD-10-CM

## 2011-02-18 DIAGNOSIS — I5022 Chronic systolic (congestive) heart failure: Secondary | ICD-10-CM

## 2011-02-18 DIAGNOSIS — E785 Hyperlipidemia, unspecified: Secondary | ICD-10-CM

## 2011-02-18 LAB — BASIC METABOLIC PANEL
BUN: 19 mg/dL (ref 6–23)
Calcium: 9.3 mg/dL (ref 8.4–10.5)
GFR: 68.17 mL/min (ref >60.00)
Potassium: 4 mEq/L (ref 3.5–5.1)

## 2011-03-01 ENCOUNTER — Other Ambulatory Visit: Payer: Self-pay | Admitting: Cardiology

## 2011-03-01 NOTE — Telephone Encounter (Signed)
.   Requested Prescriptions   Pending Prescriptions Disp Refills  . atorvastatin (LIPITOR) 20 MG tablet [Pharmacy Med Name: ATORVASTATIN 20MG  TABLETS] 30 tablet 2    Sig: TAKE 1 TABLET BY MOUTH DAILY

## 2011-03-08 ENCOUNTER — Ambulatory Visit (HOSPITAL_COMMUNITY)
Admission: RE | Admit: 2011-03-08 | Discharge: 2011-03-08 | Disposition: A | Discharge status: Home / Self Care | Payer: 59 | Source: Ambulatory Visit | Attending: Internal Medicine | Admitting: Internal Medicine

## 2011-03-08 VITALS — BP 134/72 | HR 68 | Wt 178.8 lb

## 2011-03-08 DIAGNOSIS — I509 Heart failure, unspecified: Secondary | ICD-10-CM

## 2011-03-08 DIAGNOSIS — I5022 Chronic systolic (congestive) heart failure: Secondary | ICD-10-CM | POA: Insufficient documentation

## 2011-03-08 HISTORY — DX: Chronic systolic (congestive) heart failure: I50.22

## 2011-03-08 MED ORDER — VALSARTAN 80 MG PO TABS: 160.0000 mg | ORAL_TABLET | Freq: Two times a day (BID) | ORAL | Status: DC

## 2011-03-08 NOTE — Assessment & Plan Note (Addendum)
NYHA I. Volume status stable. Will increase Valsartan to 160 mg bid. Will follow up in 4-5 weeks with repeat ECHO. Discussed purpose of heart failure clinic.    Patient seen and examined with Tonye Becket, NP. We discussed all aspects of the encounter. I agree with the assessment and plan as stated above. He is doing very well. Suspect he may have earl LV recovery. Titrate Valsartan to goal dose. F/u in 1 month with repeat echo.

## 2011-03-08 NOTE — Patient Instructions (Addendum)
Increase valsartan 160 mg bid  Do the following things EVERYDAY: 1) Weigh yourself in the morning before breakfast. Write it down and keep it in a log. 2) Take your medicines as prescribed 3) Eat low salt foods-Limit salt (sodium) to 2000mg  per day.  4) Stay as active as you can everyday  Limit your fluid intake to less than 2 liters of fluid per day. Fluid includes all drinks, coffee, juice, ice chips, soup, jello, and all other liquids.  Follow up in 5  Weeks with an ECHO

## 2011-03-08 NOTE — Progress Notes (Addendum)
Patient ID: Edward Greene, male   DOB: 1960/09/02, 50 y.o.   MRN: [id number]  CHF Consult from Dr. Shirlee Latch for HF Medication Titration  HPI: Mr. Edward Greene is a 50 yo with minimal past history presented initially after hospitalization with acute systolic CHF. About 2 months ago, patient began to notice dyspnea with heavy exertion, such as when he carried something up a flight of steps. He also was having difficulty sleeping with symptoms that sounded consistent with orthopnea. No chest pain. He went to urgent care for evaluation, and CXR showed pulmonary edema. BNP was elevated and ECG was abnormal. He was sent to the ER. Evaluation showed CHF with volume overload. Echo showed EF 15% with moderate to severe LV dilation and multiple wall motion abnormalities. Cardiac enzymes were negative. Patient was diuresed and started on cardiac meds. Left heart cath in 9/12 showed no angiographic CAD. Cardiac MRI (9/12) showed EF 15%, moderate to severe LV dilation, mild RV dysfunction, and no delayed enhancement.   He is for followup per Dr Kathyrn Lass request. Does complain of fatigue however he works 14 hours per day. Mild dyspnea walking up stairs.  Denies SOB/PND/CP/Orthnea. Weight at home 180-182. Compliant with all medications. Follows low salt diet. Exercises 4 times a week and spends 30 minutes on treadmill.   Social Hx: Denies tobacco, ETOH or drug use. Working full time,  Family History  Problem Relation Age of Onset  . Hypertension      siblings  . Cancer Mother   . Heart failure Father     pacemaker     ROS: Denies fever, chills, n/v, diarrhea, palpitations, syncope, neuro sx, orthopnea or PND.   Past Medical History  Diagnosis Date  . Asthma   . Chronic systolic heart failure     NICM    Current Outpatient Prescriptions  Medication Sig Dispense Refill  . atorvastatin (LIPITOR) 20 MG tablet TAKE 1 TABLET BY MOUTH DAILY  30 tablet  2  . carvedilol (COREG) 12.5 MG tablet Take one and  one-half tablets twice a day  90 tablet  6  . spironolactone (ALDACTONE) 25 MG tablet Take 1 tablet (25 mg total) by mouth daily.  30 tablet  6  . valsartan (DIOVAN) 80 MG tablet Take 2 tablets (160 mg total) by mouth 2 (two) times daily.  60 tablet  6     PHYSICAL EXAM: Filed Vitals:   03/08/11 0949  BP: 134/72  Pulse: 68   Weight change:  178 General:  Well appearing. No resp difficulty HEENT: normal Neck: supple. JVP flat. Carotids 2+ bilaterally; no bruits. No lymphadenopathy or thryomegaly appreciated. Cor: PMI normal. Regular rate & rhythm. No rubs, gallops or murmurs. Lungs: clear Abdomen: soft, nontender, nondistended. No hepatosplenomegaly. No bruits or masses. Good bowel sounds. Extremities: no cyanosis, clubbing, rash, edema Neuro: alert & orientedx3, cranial nerves grossly intact. Moves all 4 extremities w/o difficulty. Affect pleasant.   ASSESSMENT & PLAN:

## 2011-03-12 ENCOUNTER — Encounter (HOSPITAL_COMMUNITY): Payer: Self-pay

## 2011-03-12 DIAGNOSIS — I5022 Chronic systolic (congestive) heart failure: Secondary | ICD-10-CM | POA: Insufficient documentation

## 2011-04-16 ENCOUNTER — Encounter (HOSPITAL_COMMUNITY): Payer: 59

## 2011-04-16 ENCOUNTER — Other Ambulatory Visit (HOSPITAL_COMMUNITY): Payer: 59

## 2011-04-22 ENCOUNTER — Ambulatory Visit (HOSPITAL_COMMUNITY)
Admission: RE | Admit: 2011-04-22 | Discharge: 2011-04-22 | Disposition: A | Discharge status: Home / Self Care | Payer: 59 | Source: Ambulatory Visit | Attending: Internal Medicine | Admitting: Internal Medicine

## 2011-04-22 ENCOUNTER — Encounter (HOSPITAL_COMMUNITY): Payer: Self-pay

## 2011-04-22 VITALS — BP 126/78 | HR 57 | Wt 183.2 lb

## 2011-04-22 DIAGNOSIS — I5022 Chronic systolic (congestive) heart failure: Secondary | ICD-10-CM

## 2011-04-22 DIAGNOSIS — R0683 Snoring: Secondary | ICD-10-CM | POA: Insufficient documentation

## 2011-04-22 DIAGNOSIS — I509 Heart failure, unspecified: Secondary | ICD-10-CM

## 2011-04-22 DIAGNOSIS — I079 Rheumatic tricuspid valve disease, unspecified: Secondary | ICD-10-CM | POA: Insufficient documentation

## 2011-04-22 DIAGNOSIS — R0602 Shortness of breath: Secondary | ICD-10-CM

## 2011-04-22 DIAGNOSIS — E785 Hyperlipidemia, unspecified: Secondary | ICD-10-CM | POA: Insufficient documentation

## 2011-04-22 HISTORY — DX: Hyperlipidemia, unspecified: E78.5

## 2011-04-22 NOTE — Assessment & Plan Note (Signed)
Refer for sleep study

## 2011-04-22 NOTE — Progress Notes (Signed)
Primary Cardiologist: Dr. Shirlee Latch  HPI: Mr. Fleck is a 51 yo with minimal past history presented initially after hospitalization (9/12) with acute systolic CHF. Echo showed EF 15% with moderate to severe LV dilation and multiple wall motion abnormalities.  Left heart cath in 9/12 showed no angiographic CAD. Cardiac MRI (9/12) showed EF 15%, moderate to severe LV dilation, mild RV dysfunction, and no delayed enhancement.   He returns for routine follow up today.  Last visit his valsartan was increased to 160 mg BID.  Tolerated this well.  4-5 x week going to the gym, run/walk 45 minutes.  No edema.  No orthopnea/PND.  Compliant with medications.  Occasional dizziness in the morning.  No syncope.  Weight ranging 184-185.  Has not taken extra fluid pills. Continues with fatigue daily but works 14 hour days.  He had a repeat echo today.  Preliminary report shows EF 20-25%.  Snores at night.    His father and all the 33 brothers have had "weak hearts" or heart attacks.    ROS: Denies fever, chills, n/v, diarrhea, palpitations, syncope, neuro sx, orthopnea or PND.   Past Medical History  Diagnosis Date  . Chronic systolic heart failure     NICM  . Asthma     Childhood  . Hyperlipidemia   Cough with ACE-I  Current Outpatient Prescriptions  Medication Sig Dispense Refill  . atorvastatin (LIPITOR) 20 MG tablet TAKE 1 TABLET BY MOUTH DAILY  30 tablet  2  . carvedilol (COREG) 12.5 MG tablet Take one and one-half tablets twice a day  90 tablet  6  . spironolactone (ALDACTONE) 25 MG tablet Take 1 tablet (25 mg total) by mouth daily.  30 tablet  6  . valsartan (DIOVAN) 80 MG tablet Take 2 tablets (160 mg total) by mouth 2 (two) times daily.  60 tablet  6    PHYSICAL EXAM: Filed Vitals:   04/22/11 1213  BP: 126/78  Pulse: 57  Weight: 183 lb 4 oz (83.122 kg)  SpO2: 97%   General:  Well appearing. No resp difficulty HEENT: normal Neck: supple. JVP flat. Carotids 2+ bilaterally; no bruits. No  lymphadenopathy or thryomegaly appreciated. Cor: PMI normal. Regular rate & rhythm. No rubs, gallops or murmurs. Lungs: clear Abdomen: soft, nontender, nondistended. No hepatosplenomegaly. No bruits or masses. Good bowel sounds. Extremities: no cyanosis, clubbing, rash, edema Neuro: alert & orientedx3, cranial nerves grossly intact. Moves all 4 extremities w/o difficulty. Affect pleasant.   ASSESSMENT & PLAN:

## 2011-04-22 NOTE — Assessment & Plan Note (Addendum)
NYHA I. Volume status stable. Will increase carvedilol 25 mg BID.  Will have him follow up in 3-4 weeks.  Still hopeful heart will recover but may have a familial cardiomyopathy, can consider genetic testing in the future.  With snoring will refer for sleep study.   Patient seen and examined with Ulyess Blossom PA-C. We discussed all aspects of the encounter. I agree with the assessment and plan as stated above. Echo reviewed personally. Doing well, appears NYHA I despite persistent LV dysfunction. Ideally would like to consider ICD but if truly NYHA I may note be candidate. Will check CPX to further evaluate. Increase carvedilol. Check sleep study for OSA. Discussed with Dr, Shirlee Latch.

## 2011-04-22 NOTE — Progress Notes (Signed)
  Echocardiogram 2D Echocardiogram has been performed.  Cathie Beams Deneen 04/22/2011, 12:07 PM

## 2011-04-22 NOTE — Patient Instructions (Signed)
Increase carvedilol 25 mg (2 tabs) twice daily.    Your physician has recommended that you have a cardiopulmonary stress test (CPX). CPX testing is a non-invasive measurement of heart and lung function. It replaces a traditional treadmill stress test. This type of test provides a tremendous amount of information that relates not only to your present condition but also for future outcomes. This test combines measurements of you ventilation, respiratory gas exchange in the lungs, electrocardiogram (EKG), blood pressure and physical response before, during, and following an exercise protocol.  Follow up with Dr. Shirlee Latch in 1 month.

## 2011-05-02 ENCOUNTER — Ambulatory Visit (HOSPITAL_COMMUNITY): Payer: 59 | Attending: Physician Assistant

## 2011-05-02 DIAGNOSIS — I5022 Chronic systolic (congestive) heart failure: Secondary | ICD-10-CM

## 2011-05-07 ENCOUNTER — Ambulatory Visit (INDEPENDENT_AMBULATORY_CARE_PROVIDER_SITE_OTHER): Payer: 59 | Admitting: Pulmonary Disease

## 2011-05-07 ENCOUNTER — Encounter: Payer: Self-pay | Admitting: Pulmonary Disease

## 2011-05-07 VITALS — BP 138/90 | HR 64 | Temp 98.7°F | Ht 67.5 in | Wt 191.2 lb

## 2011-05-07 DIAGNOSIS — R0683 Snoring: Secondary | ICD-10-CM

## 2011-05-07 DIAGNOSIS — R0609 Other forms of dyspnea: Secondary | ICD-10-CM

## 2011-05-07 NOTE — Patient Instructions (Signed)
Will set up for sleep study, and arrange followup once the results are available.

## 2011-05-07 NOTE — Progress Notes (Signed)
  Subjective:    Patient ID: Edward Greene, male    DOB: [birth year], 51 y.o.   MRN: [id number]  HPI The patient is a 51 year old male who I've been asked to see for possible obstructive sleep apnea.  The patient has a known nonischemic cardiomyopathy with chronic congestive heart failure, and the question has been raised whether sleep disordered breathing could be contributing.  The patient has been noted to have loud snoring, but his bed partner has never commented on an abnormal breathing pattern during sleep.  He does not have frequent awakenings during the night, but does not feel rested upon arising.  He does have definite sleep pressure during the day with periods of inactivity, and will fall sleep with television or movies in the evenings if they are not very interesting to him.  He also notes sleep pressure with driving longer distances.  The patient has lost 10-15 pounds over the last 2 years, and his Epworth sleepiness score today is abnormal at 11.  Sleep Questionnaire: What time do you typically go to bed?( Between what hours) 10:00 pm How long does it take you to fall asleep? 1 hour How many times during the night do you wake up? 1 What time do you get out of bed to start your day? 0430 Do you drive or operate heavy machinery in your occupation? No How much has your weight changed (up or down) over the past two years? (In pounds) 10 lb (4.536 kg) Have you ever had a sleep study before? No Do you currently use CPAP? No Do you wear oxygen at any time?     Review of Systems  Constitutional: Negative for fever and unexpected weight change.  HENT: Negative for ear pain, nosebleeds, congestion, sore throat, rhinorrhea, sneezing, trouble swallowing, dental problem, postnasal drip and sinus pressure.   Eyes: Negative for redness and itching.  Respiratory: Positive for cough and shortness of breath. Negative for chest tightness and wheezing.   Cardiovascular: Positive for chest pain. Negative  for palpitations and leg swelling.  Gastrointestinal: Negative for nausea and vomiting.  Genitourinary: Negative for dysuria.  Musculoskeletal: Negative for joint swelling.  Skin: Negative for rash.  Neurological: Negative for headaches.  Hematological: Does not bruise/bleed easily.  Psychiatric/Behavioral: Negative for dysphoric mood. The patient is not nervous/anxious.        Objective:   Physical Exam Constitutional:  Modestly overweight male, no acute distress  HENT:  Nares patent without discharge, mildly enlarged turbinates.  Oropharynx without exudate, palate and uvula are very elongated.  Eyes:  Perrla, eomi, no scleral icterus  Neck:  No JVD, no TMG  Cardiovascular:  Normal rate, regular rhythm, no rubs or gallops.  No murmurs        Intact distal pulses  Pulmonary :  Normal breath sounds, no stridor or respiratory distress   No rales, rhonchi, or wheezing  Abdominal:  Soft, nondistended, bowel sounds present.  No tenderness noted.   Musculoskeletal:  No lower extremity edema noted.  Lymph Nodes:  No cervical lymphadenopathy noted  Skin:  No cyanosis noted  Neurologic:  Appears sleepy, appropriate, moves all 4 extremities without obvious deficit.         Assessment & Plan:

## 2011-05-07 NOTE — Assessment & Plan Note (Signed)
The patient's history is suggestive of clinically significant sleep disordered breathing, and he has underlying cardiac disease that can be adversely impacted by sleep apnea.  I have had a long discussion with the patient about sleep apnea, including its impact on his quality of life and cardiovascular health.  Given his underlying cardiac issues, I feel strongly that he needs to have a sleep study done.  He is not a candidate for home sleep testing, and therefore would set up for a formal PSG.  The patient agrees with this approach.

## 2011-05-08 ENCOUNTER — Ambulatory Visit: Payer: 59 | Admitting: Cardiology

## 2011-05-29 ENCOUNTER — Ambulatory Visit (INDEPENDENT_AMBULATORY_CARE_PROVIDER_SITE_OTHER): Payer: 59 | Admitting: Cardiology

## 2011-05-29 ENCOUNTER — Encounter: Payer: Self-pay | Admitting: Cardiology

## 2011-05-29 VITALS — BP 118/74 | HR 64 | Ht 68.0 in | Wt 190.4 lb

## 2011-05-29 DIAGNOSIS — R0683 Snoring: Secondary | ICD-10-CM

## 2011-05-29 DIAGNOSIS — E785 Hyperlipidemia, unspecified: Secondary | ICD-10-CM

## 2011-05-29 DIAGNOSIS — I5022 Chronic systolic (congestive) heart failure: Secondary | ICD-10-CM

## 2011-05-29 MED ORDER — CARVEDILOL 25 MG PO TABS: 25.0000 mg | ORAL_TABLET | Freq: Two times a day (BID) | ORAL | Status: DC

## 2011-05-29 NOTE — Patient Instructions (Addendum)
Increase coreg(carvedilol) to 25mg  twice a day. You can take two 12.5mg  tablets two times a day.  Your physician has requested that you have an echocardiogram. Echocardiography is a painless test that uses sound waves to create images of your heart. It provides your doctor with information about the size and shape of your heart and how well your heart's chambers and valves are working. This procedure takes approximately one hour. There are no restrictions for this procedure. End of March 2013  Your physician recommends that you schedule a follow-up appointment a few days after the echo in March  to see Dr Shirlee Latch.   Genetic testing for familial cardiomyopathy done by [facility]

## 2011-05-29 NOTE — Assessment & Plan Note (Signed)
Excellent lipids on statin.  Continue.  

## 2011-05-29 NOTE — Progress Notes (Signed)
51 yo with minimal past history presented initially after hospitalization with acute systolic CHF.  In 9/12, patient began to notice dyspnea with heavy exertion, such as when he carried something up a flight of steps. He also was having difficulty sleeping with symptoms that sounded consistent with orthopnea.  No chest pain.  He went to urgent care for evaluation, and CXR showed pulmonary edema.  BNP was elevated and ECG was abnormal.  He was sent to the ER.  Evaluation showed CHF with volume overload.  Echo showed EF 15% with moderate to severe LV dilation and multiple wall motion abnormalities.  Cardiac enzymes were negative.  Patient was diuresed and started on cardiac meds.  Left heart cath in 9/12 showed no angiographic CAD.  Cardiac MRI (9/12) showed EF 15%, moderate to severe LV dilation, mild RV dysfunction, and no delayed enhancement.  Repeat echo in 1/13 showed that EF remained 15%.  Cardiopulmonary exercise test, however, showed no significant functional limitation with VO2 max 33.6 and normal VE/VCO2 slope.   Patient has been doing well recently.  No exertional dyspnea besides mild dyspnea if he runs up steps.  He does feel fatigued in general.  He is walking for 30-40 minutes daily and doing some jogging without problems.  No orthopnea or PND.  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  Upon further questioning, he tells me that he is not able to exert himself as hard as he could a year or so ago.     Patient reports snoring at night and, as above, fatigue during the day.  He has been set up for a sleep study.   Labs (9/12): K 4.3, creatinine 1.7 => 1.6, pro-BNP 907=>218, SPEP negative, HIV negative, transferrin saturation 47%, LDL 145, HDL 33 Labs (10/12): K 3.5, creatinine 1.4, LDL 55, HDL 38, BNP 81 Labs (11/12): K 4, creatinine 1.4  PMH: 1. Childhood asthma 2. Hyperlipidemia 3. Systolic CHF: Echo (9/12) with EF 15%, moderate-severe LV dilation,  inferior/posterior/inferoseptal/apical akinesis, septal/anterolateral severe hypokinesis, mild to moderate RV dilation with normal RV systolic function.  HIV negative, SPEP negative.  No history of ETOH or drugs.  No history of HTN.  LHC (9/12) with no angiographic CAD. Possible viral myocarditis versus familial cardiomyopathy.  Cardiac MRI (9/12) with EF 15%, moderate to severe LV dilation, mild RV dysfunction, no myocardial delayed enhancement.  Echo (1/13): EF 15%, diffuse HK, grade I diastolic dysfunction.  CPX (1/13): VO2 max 33.6, VE/VCO2 normal.  4. ACEI cough  FH: Father with CHF in his 22s, thinks he had an MI.  Father has PCM.  Father had 9 brothers, all had MIs or CHF.  Sister with MI in her 75s.   SH: Lives in [place] with his wife.  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  No tobacco, no ETOH, no no drugs.   ROS: All systems reviewed and negative except as per HPI.   Current Outpatient Prescriptions  Medication Sig Dispense Refill  . atorvastatin (LIPITOR) 20 MG tablet TAKE 1 TABLET BY MOUTH DAILY  30 tablet  2  . spironolactone (ALDACTONE) 25 MG tablet Take 1 tablet (25 mg total) by mouth daily.  30 tablet  6  . DISCONTD: carvedilol (COREG) 12.5 MG tablet Take one and one-half tablets twice a day  90 tablet  6  . carvedilol (COREG) 25 MG tablet Take 1 tablet (25 mg total) by mouth 2 (two) times  daily.  60 tablet  11  . valsartan (DIOVAN) 80 MG tablet Take 2 tablets (160 mg total) by mouth 2 (two) times daily.  60 tablet  6    BP 118/74  Pulse 64  Ht 5\' 8"  (1.727 m)  Wt 190 lb 6.4 oz (86.365 kg)  BMI 28.95 kg/m2 General: NAD Neck: No JVD, no thyromegaly or thyroid nodule.  Lungs: Clear to auscultation bilaterally with normal respiratory effort. CV: Nondisplaced PMI.  Heart regular S1/S2, no S3/S4, no murmur.  No peripheral edema.  No carotid bruit.  Normal pedal pulses.  Abdomen: Soft, nontender, no hepatosplenomegaly, no distention.    Neurologic: Alert and oriented x 3.  Psych: Normal affect. Extremities: No clubbing or cyanosis.

## 2011-05-29 NOTE — Assessment & Plan Note (Signed)
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  Left heart cath showed no angiographic CAD. No history of HTN, drug use, or ETOH.  HIV and SPEP were negative.  He is minimally symptomatic currently, and he does not appear volume overloaded. Upon in-depth questioning, though he showed no significant functional limitation on the CPX, he does report that he fatigues more easily and cannot exert himself to the extent that he could a year ago.  Cardiac MRI showed no myocardial delayed enhancement.  It is possible that his cardiomyopathy is from viral myocarditis versus a familial cardiomyopathy such as from a lamin mutation.  He knows that he has had multiple relatives with heart failure but does not have much detail about this.  Most of them have seemed to be older.   - Increase Coreg to 25 mg bid today.  - Continue valsartan and spironolactone.   - Next step will be Bidil.   - Echo in 3/13 after 6 months of medical therapy.  Though he did not show significant functional limitation on CPX and does not have exertional dyspnea with mild to moderate workloads, he does report that his exercise tolerance is significantly decreased from a year or so ago.  I discussed his situation with Dr. Johney Frame.  If EF is not improved in 3/13, he will get an ICD.   - BMET when he comes to get his echo.  - I talked to Mr Quinto today about genetic testing for familial cardiomyopathy.  If a mutation is found, his children could be screened.  We will see if his insurance will cover this.  - I am going to have him followup in CHF clinic in 1 month for med titration then will see me in 3 months.

## 2011-05-29 NOTE — Assessment & Plan Note (Signed)
Sleep study planned

## 2011-06-07 ENCOUNTER — Ambulatory Visit (HOSPITAL_BASED_OUTPATIENT_CLINIC_OR_DEPARTMENT_OTHER): Payer: 59 | Attending: Pulmonary Disease | Admitting: Radiology

## 2011-06-07 VITALS — Ht 68.0 in | Wt 190.0 lb

## 2011-06-07 DIAGNOSIS — G4730 Sleep apnea, unspecified: Secondary | ICD-10-CM | POA: Insufficient documentation

## 2011-06-07 DIAGNOSIS — R0683 Snoring: Secondary | ICD-10-CM

## 2011-06-07 DIAGNOSIS — G4733 Obstructive sleep apnea (adult) (pediatric): Secondary | ICD-10-CM

## 2011-06-07 DIAGNOSIS — G4710 Hypersomnia, unspecified: Secondary | ICD-10-CM | POA: Insufficient documentation

## 2011-06-12 ENCOUNTER — Other Ambulatory Visit: Payer: Self-pay | Admitting: Cardiology

## 2011-06-22 DIAGNOSIS — G4710 Hypersomnia, unspecified: Secondary | ICD-10-CM

## 2011-06-22 DIAGNOSIS — G4730 Sleep apnea, unspecified: Secondary | ICD-10-CM

## 2011-06-22 NOTE — Procedures (Signed)
Edward Greene, Edward Greene              ACCOUNT NO.:  [id number]  MEDICAL RECORD NO.:  [id number]          PATIENT TYPE:  OUT  LOCATION:  SLEEP CENTER                 FACILITY:  [facility]  PHYSICIAN:  Barbaraann Share, MD,FCCPDATE OF BIRTH:  October 15, 1960  DATE OF STUDY:  06/07/2011                           NOCTURNAL POLYSOMNOGRAM  REFERRING PHYSICIAN:  Barbaraann Share, MD,FCCP  INDICATION FOR STUDY:  Hypersomnia with sleep apnea.  EPWORTH SLEEPINESS SCORE:  5.  MEDICATIONS:  SLEEP ARCHITECTURE:  The patient had total sleep time of 402 minutes with no slow wave sleep, but adequate quantity of REM. Sleep onset latency was normal at 4.5 minutes and REM onset was normal at 94 minutes.  Sleep efficiency was adequate at 89%.  RESPIRATORY DATA:  The patient was found to have 3 apneas and 11 obstructive hypopneas, giving him an apnea-hypopnea index of only 2 events per hour.  The events occurred in all body positions and there was moderate snoring noted throughout.  OXYGEN DATA:  There was O2 desaturation as low as 83% transiently, however, the patient only spent 12 minutes the entire night less than 88%.  CARDIAC DATA:  Occasional PVC noted, but no clinically significant arrhythmias were seen.  MOVEMENT-PARASOMNIA:  The patient had no significant leg jerks or other abnormal behaviors noted.  IMPRESSIONS-RECOMMENDATIONS: 1. Small numbers of obstructive events, which do not meet the AHI     criteria for the obstructive sleep apnea syndrome.  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  He may     benefit from modest weight loss and also non supine sleep. 2. Occasional PVC noted, but no clinically significant arrhythmias     were seen.    Barbaraann Share, MD,FCCP Diplomate, American Board of Sleep Medicine   [facility]/MED[facility]  D:  06/22/2011 18:13:19  T:  06/22/2011 19:00:11  Job:  [id number]

## 2011-06-24 ENCOUNTER — Telehealth: Payer: Self-pay | Admitting: *Deleted

## 2011-06-24 NOTE — Telephone Encounter (Signed)
Per KC, pt needs ov with him to discuss sleep study results.  LMOM for pt TCB 

## 2011-06-25 NOTE — Telephone Encounter (Signed)
LMOM for pt TCB 

## 2011-06-25 NOTE — Telephone Encounter (Signed)
Pt returned call.  Will call back Leanora Ivanoff

## 2011-06-25 NOTE — Telephone Encounter (Signed)
LM with spouse to have the pt call back and schedule ov with KC to discuss results.

## 2011-06-26 ENCOUNTER — Other Ambulatory Visit: Payer: Self-pay

## 2011-06-26 ENCOUNTER — Ambulatory Visit (HOSPITAL_COMMUNITY): Payer: 59 | Attending: Cardiology

## 2011-06-26 DIAGNOSIS — I5022 Chronic systolic (congestive) heart failure: Secondary | ICD-10-CM

## 2011-06-26 DIAGNOSIS — I519 Heart disease, unspecified: Secondary | ICD-10-CM | POA: Insufficient documentation

## 2011-06-26 DIAGNOSIS — I509 Heart failure, unspecified: Secondary | ICD-10-CM | POA: Insufficient documentation

## 2011-06-26 DIAGNOSIS — I428 Other cardiomyopathies: Secondary | ICD-10-CM

## 2011-06-26 DIAGNOSIS — E785 Hyperlipidemia, unspecified: Secondary | ICD-10-CM | POA: Insufficient documentation

## 2011-07-01 ENCOUNTER — Ambulatory Visit (INDEPENDENT_AMBULATORY_CARE_PROVIDER_SITE_OTHER): Payer: 59 | Admitting: Cardiology

## 2011-07-01 ENCOUNTER — Ambulatory Visit: Payer: 59 | Admitting: Cardiology

## 2011-07-01 ENCOUNTER — Encounter: Payer: Self-pay | Admitting: Cardiology

## 2011-07-01 VITALS — BP 120/84 | HR 74 | Ht 68.0 in | Wt 195.8 lb

## 2011-07-01 DIAGNOSIS — I5022 Chronic systolic (congestive) heart failure: Secondary | ICD-10-CM

## 2011-07-01 DIAGNOSIS — R059 Cough, unspecified: Secondary | ICD-10-CM

## 2011-07-01 DIAGNOSIS — R053 Chronic cough: Secondary | ICD-10-CM | POA: Insufficient documentation

## 2011-07-01 DIAGNOSIS — R05 Cough: Secondary | ICD-10-CM

## 2011-07-01 DIAGNOSIS — E785 Hyperlipidemia, unspecified: Secondary | ICD-10-CM

## 2011-07-01 DIAGNOSIS — I428 Other cardiomyopathies: Secondary | ICD-10-CM

## 2011-07-01 MED ORDER — ISOSORB DINITRATE-HYDRALAZINE 20-37.5 MG PO TABS: 0.5000 | ORAL_TABLET | Freq: Three times a day (TID) | ORAL | Status: DC

## 2011-07-01 NOTE — Assessment & Plan Note (Signed)
Edward Greene has seen Dr. Shelle Iron in the past for OSA workup.  I will have him see him again for evaluation of chronic cough despite stopping ACEI.  He is not significantly volume overloaded on exam.

## 2011-07-01 NOTE — Assessment & Plan Note (Signed)
Excellent lipids on statin.  Continue.  

## 2011-07-01 NOTE — Assessment & Plan Note (Signed)
Cardiomyopathy with dilated LV and EF 15% initially, last echo with EF 25-30% (some improvement).  Left heart cath showed no angiographic CAD. No history of HTN, drug use, or ETOH.  HIV and SPEP were negative.  He is minimally symptomatic currently, and he does not appear volume overloaded. Upon in-depth questioning, though he showed no significant functional limitation on the CPX compared to an age-matched normal, he does report that he fatigues more easily and cannot exert himself to the extent that he could a year ago.  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  Cardiac MRI showed no myocardial delayed enhancement.  It is possible that his cardiomyopathy is from viral myocarditis versus a familial cardiomyopathy such as from a lamin mutation.  He knows that he has had multiple relatives with heart failure but does not have much detail about this.  Most of them have seemed to be older.   - Continue Coreg, valsartan and spironolactone.   - Start Bidil 1/2 tab tid.   - EF marginally improved after 6 months of good therapy, but still < 30%.  He is NYHA class II (compared to his baseline).  We talked about ICD.  I am going to refer him to Dr. Johney Frame.  Would prefer [employer] or [employer] (for Optivol or Corevue).  - I talked to Edward Greene today about genetic testing for familial cardiomyopathy.  If a mutation were found, his children could be screened.  He wants to hold off on this.  His 7 year-old daughter should get a screening echo. - He needs a BMET when he returns to see Dr. Johney Frame.

## 2011-07-01 NOTE — Progress Notes (Signed)
51 yo with minimal past history presented initially after hospitalization with acute systolic CHF.  In 9/12, patient began to notice dyspnea with heavy exertion, such as when he carried something up a flight of steps. He also was having difficulty sleeping with symptoms that sounded consistent with orthopnea.  No chest pain.  He went to urgent care for evaluation, and CXR showed pulmonary edema.  BNP was elevated and ECG was abnormal.  He was sent to the ER.  Evaluation showed CHF with volume overload.  Echo showed EF 15% with moderate to severe LV dilation and multiple wall motion abnormalities.  Cardiac enzymes were negative.  Patient was diuresed and started on cardiac meds.  Left heart cath in 9/12 showed no angiographic CAD.  Cardiac MRI (9/12) showed EF 15%, moderate to severe LV dilation, mild RV dysfunction, and no delayed enhancement.  Repeat echo in 1/13 showed that EF remained 15%.  Cardiopulmonary exercise test, however, showed no significant functional limitation with VO2 max 33.6 and normal VE/VCO2 slope.   Patient has been stable.  No exertional dyspnea besides mild dyspnea if he runs up steps.  He feels generally fatigued, however, and does not have the exercise tolerance that he had a year or two ago.  He is walking for 30-40 minutes daily and doing some jogging without problems.  No orthopnea or PND.  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  Repeat echo done last month showed EF somewhat improved, now 25-30%.    Sleep study was negative for OSA.  He continues to have a chronic cough after stopping ACEI.  No GERD symptoms.   Labs (9/12): K 4.3, creatinine 1.7 => 1.6, pro-BNP 907=>218, SPEP negative, HIV negative, transferrin saturation 47%, LDL 145, HDL 33 Labs (10/12): K 3.5, creatinine 1.4, LDL 55, HDL 38, BNP 81 Labs (11/12): K 4, creatinine 1.4  PMH: 1. Childhood asthma 2. Hyperlipidemia 3. Systolic CHF: Echo (9/12) with EF 15%, moderate-severe LV  dilation, inferior/posterior/inferoseptal/apical akinesis, septal/anterolateral severe hypokinesis, mild to moderate RV dilation with normal RV systolic function.  HIV negative, SPEP negative.  No history of ETOH or drugs.  No history of HTN.  LHC (9/12) with no angiographic CAD. Possible viral myocarditis versus familial cardiomyopathy.  Cardiac MRI (9/12) with EF 15%, moderate to severe LV dilation, mild RV dysfunction, no myocardial delayed enhancement.  Echo (1/13): EF 15%, diffuse HK, grade I diastolic dysfunction.  CPX (1/13): VO2 max 33.6, VE/VCO2 normal.  Echo (3/13): EF 25-30%, global hypokinesis, normal RV.  4. ACEI cough  FH: Father with CHF in his 68s, thinks he had an MI.  Father has PCM.  Father had 9 brothers, all had MIs or CHF.  Sister with MI in her 71s.   SH: Lives in [place] with his wife.  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  No tobacco, no ETOH, no no drugs.   ROS: All systems reviewed and negative except as per HPI.   Current Outpatient Prescriptions  Medication Sig Dispense Refill  . atorvastatin (LIPITOR) 20 MG tablet TAKE 1 TABLET BY MOUTH DAILY  30 tablet  4  . carvedilol (COREG) 25 MG tablet Take 1 tablet (25 mg total) by mouth 2 (two) times daily.  60 tablet  11  . spironolactone (ALDACTONE) 25 MG tablet Take 1 tablet (25 mg total) by mouth daily.  30 tablet  6  . valsartan (DIOVAN) 80 MG tablet Take 2  tablets (160 mg total) by mouth 2 (two) times daily.  60 tablet  6  . isosorbide-hydrALAZINE (BIDIL) 20-37.5 MG per tablet Take 0.5 tablets by mouth 3 (three) times daily.  60 tablet  6    BP 120/84  Pulse 74  Ht 5\' 8"  (1.727 m)  Wt 195 lb 12.8 oz (88.814 kg)  BMI 29.77 kg/m2 General: NAD Neck: No JVD, no thyromegaly or thyroid nodule.  Lungs: Clear to auscultation bilaterally with normal respiratory effort. CV: Nondisplaced PMI.  Heart regular S1/S2, no S3/S4, no murmur.  No peripheral edema.  No carotid bruit.  Normal pedal  pulses.  Abdomen: Soft, nontender, no hepatosplenomegaly, no distention.  Neurologic: Alert and oriented x 3.  Psych: Normal affect. Extremities: No clubbing or cyanosis.

## 2011-07-01 NOTE — Patient Instructions (Signed)
You are being referred to see Dr Hillis Range to discuss possible ICD placement for cardiomyopathy.  You are being referred to see Dr Shelle Iron for evaluation of chronic cough  Please start Bidil 1/2 tablet daily  Continue all other medications as listed

## 2011-07-03 ENCOUNTER — Telehealth: Payer: Self-pay | Admitting: *Deleted

## 2011-07-03 DIAGNOSIS — I428 Other cardiomyopathies: Secondary | ICD-10-CM

## 2011-07-03 NOTE — Telephone Encounter (Signed)
He ought to have one prior to that. See when would be good for him.  ----- Message ----- From: Jacqlyn Krauss, RN Sent: 07/03/2011 11:04 AM To: Laurey Morale, MD  His appt with Dr Johney Frame isn't until 08/21/11. Do you want him to have BMET before that? Kyrra Prada ----- Message ----- From: Laurey Morale, MD Sent: 07/01/2011 4:21 PM To: Jacqlyn Krauss, RN  Thurston Hole, please have Mr Rout get a BMET when he comes to see Dr. Johney Frame.    07/03/11 I spoke with pt. He will get BMET 07/23/11.

## 2011-07-03 NOTE — Telephone Encounter (Signed)
Pt already has a pending appt with [facility] for a pulm consult for cough scheduled for 07/23/11. Per KC, ok to wait until consult appt to also discuss sleep study results.

## 2011-07-23 ENCOUNTER — Ambulatory Visit (INDEPENDENT_AMBULATORY_CARE_PROVIDER_SITE_OTHER)
Admission: RE | Admit: 2011-07-23 | Discharge: 2011-07-23 | Disposition: A | Discharge status: Home / Self Care | Payer: 59 | Source: Ambulatory Visit | Attending: Pulmonary Disease | Admitting: Pulmonary Disease

## 2011-07-23 ENCOUNTER — Ambulatory Visit (INDEPENDENT_AMBULATORY_CARE_PROVIDER_SITE_OTHER): Payer: 59 | Admitting: Pulmonary Disease

## 2011-07-23 ENCOUNTER — Encounter: Payer: Self-pay | Admitting: Pulmonary Disease

## 2011-07-23 ENCOUNTER — Other Ambulatory Visit: Payer: 59

## 2011-07-23 VITALS — BP 118/80 | HR 73 | Temp 98.0°F | Ht 68.0 in | Wt 193.6 lb

## 2011-07-23 DIAGNOSIS — R059 Cough, unspecified: Secondary | ICD-10-CM

## 2011-07-23 DIAGNOSIS — R053 Chronic cough: Secondary | ICD-10-CM

## 2011-07-23 DIAGNOSIS — R05 Cough: Secondary | ICD-10-CM

## 2011-07-23 MED ORDER — OMEPRAZOLE 40 MG PO CPDR: 40.0000 mg | DELAYED_RELEASE_CAPSULE | Freq: Every morning | ORAL | Status: DC

## 2011-07-23 NOTE — Assessment & Plan Note (Signed)
I finally was able to review the patient's sleep study with him, which shows no significant sleep disordered breathing.  I have asked him to work on modest weight loss, and try to avoid supine sleep in order to limit his snoring.

## 2011-07-23 NOTE — Progress Notes (Signed)
  Subjective:    Patient ID: Edward Greene, male    DOB: 1960-07-12, 51 y.o.   MRN: [id number]  HPI The patient is a 51 year old male who I've been asked to see for chronic cough.  The patient states that he has had a cough for at least a year, but does not feel that it has been worsening.  He describes this as dry and hacky, and is intermittent during the day.  It is worsened with talking, and also after drinking a soda or after eating.  He does note a tickle in his throat, and although has throat clearing, he does not feel that it is frequent.  He denies postnasal drip or allergy symptoms, and also denies significant reflux or other GI symptoms.  He does have a history of asthma as a child, but quickly "grew out of it".  He has had some dyspnea on exertion, but also has a known cardiomyopathy.  His last chest x-ray was in 2012.   Review of Systems  Constitutional: Negative for fever and unexpected weight change.  HENT: Negative for ear pain, nosebleeds, congestion, sore throat, rhinorrhea, sneezing, trouble swallowing, dental problem, postnasal drip and sinus pressure.   Eyes: Negative for redness and itching.  Respiratory: Positive for cough. Negative for choking, chest tightness, shortness of breath and wheezing.   Cardiovascular: Negative for palpitations and leg swelling.  Gastrointestinal: Negative for nausea and vomiting.  Genitourinary: Negative for dysuria.  Musculoskeletal: Negative for joint swelling.  Skin: Negative for rash.  Neurological: Negative for headaches.  Hematological: Does not bruise/bleed easily.  Psychiatric/Behavioral: Negative for dysphoric mood. The patient is not nervous/anxious.        Objective:   Physical Exam Constitutional:  Well developed, no acute distress  HENT:  Nares patent without discharge, but enlarge turbinates.  Oropharynx without exudate, palate and uvula are elongated.   Eyes:  Perrla, eomi, no scleral icterus  Neck:  No JVD, no  TMG  Cardiovascular:  Normal rate, regular rhythm, no rubs or gallops.  No murmurs        Intact distal pulses  Pulmonary :  Normal breath sounds, no stridor or respiratory distress   No rales, rhonchi, or wheezing  Abdominal:  Soft, nondistended, bowel sounds present.  No tenderness noted.   Musculoskeletal:  No lower extremity edema noted.  Lymph Nodes:  No cervical lymphadenopathy noted  Skin:  No cyanosis noted  Neurologic:  Alert, appropriate, moves all 4 extremities without obvious deficit.         Assessment & Plan:

## 2011-07-23 NOTE — Assessment & Plan Note (Signed)
The patient has a chronic cough of one year duration which sounds more upper airway in origin than lower.  He does have a history of asthma as a child, but nothing to suggest a recurrence currently.  His lungs are totally clear to auscultation.  The most common cause of upper airway cough is reflux disease and postnasal drip.  The patient clearly notes that his cough is worse after eating and drinking soda.  I suspect he also has a component of cyclical coughing, as evidenced by worsening with increased conversation.  I will treat him with a proton pump inhibitor and also an antihistamine, and I have also reviewed the behavioral therapies to treat cyclical coughing.  I will check a chest x-ray today for completeness.  If he continues to have issues with his coughing, will consider spirometry to rule out asthma.

## 2011-07-23 NOTE — Patient Instructions (Signed)
Take omeprazole 40mg  one each am for possible reflux for next 3 weeks. Chlorpheniramine 8mg  one at bedtime each night for next 3 weeks. Limit voice use as much as possible No throat clearing, and keep hard candy in your mouth during the day to limit tickle and cough.  Will check a cxr for completeness, and let you know the results. Please call me in 3 weeks with update on how your cough is doing.  Will return your call.

## 2011-08-19 ENCOUNTER — Other Ambulatory Visit: Payer: Self-pay | Admitting: Pulmonary Disease

## 2011-08-19 NOTE — Telephone Encounter (Signed)
Please advise if ok to refill, thanks 

## 2011-08-19 NOTE — Telephone Encounter (Signed)
Please do not send these to me thru the system.

## 2011-08-19 NOTE — Telephone Encounter (Signed)
Do not send these thru the system

## 2011-08-19 NOTE — Telephone Encounter (Signed)
Patient was to call in 3 weeks to let [facility] know how his cough was doing. Will contact the pt to see how he is doing before sending refill on this medication.  LMTCB with spouse. She will have pt return my call.

## 2011-08-20 ENCOUNTER — Other Ambulatory Visit: Payer: Self-pay | Admitting: Cardiology

## 2011-08-20 ENCOUNTER — Telehealth: Payer: Self-pay | Admitting: Pulmonary Disease

## 2011-08-20 DIAGNOSIS — I5022 Chronic systolic (congestive) heart failure: Secondary | ICD-10-CM

## 2011-08-20 MED ORDER — VALSARTAN 80 MG PO TABS: 160.0000 mg | ORAL_TABLET | Freq: Two times a day (BID) | ORAL | Status: DC

## 2011-08-20 NOTE — Telephone Encounter (Signed)
I spoke with the pt and he states his cough has improved by 20%. He has continued on the Omeprazole 40mg  daily and Chlorpheniramine 8mg  at bedtime. He is needing a refill on the Omeprazole and wants to know if he needs to make any changes to help the cough. RX has not been sent to his pharmacy. Pls advise.

## 2011-08-20 NOTE — Telephone Encounter (Signed)
Please advise if ok to refill, Thanks! 

## 2011-08-20 NOTE — Telephone Encounter (Signed)
Please advise if ok to refill, thanks 

## 2011-08-21 ENCOUNTER — Institutional Professional Consult (permissible substitution): Payer: 59 | Admitting: Internal Medicine

## 2011-08-21 MED ORDER — OMEPRAZOLE 40 MG PO CPDR: 40.0000 mg | DELAYED_RELEASE_CAPSULE | Freq: Every morning | ORAL | Status: DC

## 2011-08-21 NOTE — Telephone Encounter (Signed)
Would like him to stay on current treatment, and see me back in 4 weeks to re-assess.  He needs to continue the behavioral treatments as well such as hard candy, no throat clearing, limiting voice use.

## 2011-08-21 NOTE — Telephone Encounter (Signed)
Called, spoke with pt.  I informed him Dr. Shelle Iron would like him to stay on current treatment and would like to see him back in 4 wks to re-asses.  Advised Dr. Shelle Iron would also like him to cont with the behavioral treatments as well - hard candy, no throat clearing, and limiting voice.  He verbalized understanding of these recs.  We have scheduled pt to see Dr. Shelle Iron on September 17, 2011 at 9:45 am and omeprazole rx sent to pharm to last until this appt -- pt is aware and was informed to call back if anything is needed prior to this appt.  Note:  Per pt's request, I have placed an appt card reminder in the mail to him home address which I did verify.

## 2011-08-22 ENCOUNTER — Encounter: Payer: Self-pay | Admitting: Internal Medicine

## 2011-08-23 ENCOUNTER — Telehealth: Payer: Self-pay | Admitting: Pulmonary Disease

## 2011-08-23 NOTE — Telephone Encounter (Signed)
LMTCB

## 2011-08-27 ENCOUNTER — Telehealth: Payer: Self-pay | Admitting: Cardiology

## 2011-08-27 NOTE — Telephone Encounter (Signed)
Spoke with pharmacist. There is not a generic for Diovan. LMTCB for pt.

## 2011-08-27 NOTE — Telephone Encounter (Signed)
LMTCB x2  

## 2011-08-27 NOTE — Telephone Encounter (Addendum)
Pt wants to change diovan to something cheaper, pls call if can't reach can try [phone]

## 2011-08-27 NOTE — Telephone Encounter (Signed)
KC never prescribed this medication.  Pt needs to go through his PCP for this >> Dr Shirlee Latch.  Pt returned call.  Advised pt that because Dr Shirlee Latch is the prescribing physician for his diovan, that office will need to be contacted regarding a cheaper alternative.  Pt verbalized his understanding.

## 2011-09-02 NOTE — Telephone Encounter (Signed)
Spoke with pt. Pt states 2 weeks ago his insurance changed and he is going to have to pay more than $200 for Diovan. I called the pharmacy and there is no generic available for Diovan.  I will forward to Dr Shirlee Latch for review and recommendations.

## 2011-09-02 NOTE — Telephone Encounter (Signed)
F/u   Patient called for status and on this matter, please return call to [phone].

## 2011-09-02 NOTE — Telephone Encounter (Signed)
Stop Diovan, take losartan 50 mg bid.

## 2011-09-03 NOTE — Telephone Encounter (Signed)
Spoke with pt's wife. Pt is going to be able continue to get Diovan. His pharmacy had not filed his insurance correctly and he is going to continue Diovan for $40 copay.

## 2011-09-14 ENCOUNTER — Other Ambulatory Visit: Payer: Self-pay | Admitting: Cardiology

## 2011-09-17 ENCOUNTER — Ambulatory Visit: Payer: 59 | Admitting: Pulmonary Disease

## 2011-09-24 ENCOUNTER — Other Ambulatory Visit: Payer: Self-pay | Admitting: Pulmonary Disease

## 2011-09-24 MED ORDER — OMEPRAZOLE 40 MG PO CPDR: 40.0000 mg | DELAYED_RELEASE_CAPSULE | Freq: Every morning | ORAL | Status: DC

## 2011-09-24 NOTE — Telephone Encounter (Signed)
PHARMACY REQUESTING  OMEPRAZOLE 40 MG TAKE 1 CAP EVERY MORNING.  #30 x1  PT HAS APPT 11-06-2011.  RX SENT

## 2011-10-28 ENCOUNTER — Telehealth: Payer: Self-pay | Admitting: Pulmonary Disease

## 2011-10-28 NOTE — Telephone Encounter (Signed)
Member ID # [id number].Omeprazole APPROVED for twice a day dosing. Until 1.24.2014. Pharmacy has been notified.

## 2011-10-29 ENCOUNTER — Other Ambulatory Visit: Payer: Self-pay | Admitting: Pulmonary Disease

## 2011-10-29 MED ORDER — OMEPRAZOLE 40 MG PO CPDR: 40.0000 mg | DELAYED_RELEASE_CAPSULE | Freq: Every morning | ORAL | Status: DC

## 2011-10-29 NOTE — Telephone Encounter (Signed)
Faxed refill request received from [facility]  [address] for Omeprazole 40 mg. Take 1 capsule by mouth every morning. Patient last seen 07/23/11. Patient has a pending ov for 11/06/11. Refill sent in to pharmacy.

## 2011-11-06 ENCOUNTER — Encounter: Payer: Self-pay | Admitting: Pulmonary Disease

## 2011-11-06 ENCOUNTER — Ambulatory Visit (INDEPENDENT_AMBULATORY_CARE_PROVIDER_SITE_OTHER): Payer: 59 | Admitting: Pulmonary Disease

## 2011-11-06 VITALS — BP 124/82 | HR 92 | Temp 98.0°F | Ht 67.5 in | Wt 197.6 lb

## 2011-11-06 DIAGNOSIS — R053 Chronic cough: Secondary | ICD-10-CM

## 2011-11-06 DIAGNOSIS — R05 Cough: Secondary | ICD-10-CM

## 2011-11-06 DIAGNOSIS — R059 Cough, unspecified: Secondary | ICD-10-CM

## 2011-11-06 NOTE — Assessment & Plan Note (Addendum)
The patient has a chronic cough but I continue to believe it is more upper airway in origin than lower.  He initially had improvement on a b.i.d. Proton pump inhibitor and using chlorpheniramine, but unfortunately quit taking the antihistamine and now his cough has returned to his usual baseline.  He is continuing to have postnasal drip and throat clearing.  I have stressed to him the importance of sticking to the cough algorithm, and we cannot move on to the next treatment step if he does not have 100% compliance to the initial plan.  I have asked him to continue with his proton pump inhibitor, and begin taking the antihistamine again at bedtime and lunch.  Of note, his spirometry today is totally normal.

## 2011-11-06 NOTE — Progress Notes (Signed)
  Subjective:    Patient ID: Edward Greene, male    DOB: 12/13/1960, 51 y.o.   MRN: [id number]  HPI The patient comes in today for followup of his known chronic cough.  At the last visit, he was felt that an upper airway cough, possibly related to postnasal drip and reflux.  He was treated with b.i.d. Proton pump inhibitor, and asked to try a sedating antihistamine at bedtime.  He did this for a few weeks, and reported by phone that his cough was 20% improved.  I asked him to continue with this, but unfortunately he stopped taking his antihistamine.  His cough is now back to its usual level, and he is continuing to have postnasal drip with chronic throat clearing.  He is denying any dyspnea on exertion.  The patient also is staying on an ARB instead of an ACE.   Review of Systems  Constitutional: Negative for fever and unexpected weight change.  HENT: Positive for postnasal drip. Negative for ear pain, nosebleeds, congestion, sore throat, rhinorrhea, sneezing, trouble swallowing, dental problem and sinus pressure.   Eyes: Negative for redness and itching.  Respiratory: Positive for cough. Negative for chest tightness, shortness of breath and wheezing.   Cardiovascular: Negative for palpitations and leg swelling.  Gastrointestinal: Negative for nausea and vomiting.  Genitourinary: Negative for dysuria.  Musculoskeletal: Negative for joint swelling.  Skin: Negative for rash.  Neurological: Negative for headaches.  Hematological: Does not bruise/bleed easily.  Psychiatric/Behavioral: Negative for dysphoric mood. The patient is not nervous/anxious.   All other systems reviewed and are negative.       Objective:   Physical Exam Well-developed male in no acute distress Nodes without purulence or discharge noted Oropharynx clear Chest totally clear to auscultation, no wheezing Cardiac exam with regular rate and rhythm Lower extremities without edema, cyanosis Alert and oriented, moves  all 4 extremities.       Assessment & Plan:

## 2011-11-06 NOTE — Patient Instructions (Addendum)
Stay on your medication for acid reflux twice a day for now. Take chlorpheniramine 4mg , 2 at bedtime and one at lunch for next 4 weeks. Try Qnasal 2 sprays each nostril once a day.  Do not sniff when spraying. Please call in 3 weeks with how things are going.  If you are not seeing signficant improvement, will move on to plan B.

## 2011-11-18 ENCOUNTER — Other Ambulatory Visit: Payer: Self-pay | Admitting: *Deleted

## 2011-11-18 MED ORDER — ATORVASTATIN CALCIUM 20 MG PO TABS: 20.0000 mg | ORAL_TABLET | Freq: Every day | ORAL | Status: DC

## 2012-01-23 ENCOUNTER — Other Ambulatory Visit: Payer: Self-pay | Admitting: *Deleted

## 2012-01-23 MED ORDER — ATORVASTATIN CALCIUM 20 MG PO TABS: 20.0000 mg | ORAL_TABLET | Freq: Every day | ORAL | Status: DC

## 2012-01-29 ENCOUNTER — Other Ambulatory Visit: Payer: Self-pay | Admitting: Pulmonary Disease

## 2012-02-16 ENCOUNTER — Other Ambulatory Visit: Payer: Self-pay | Admitting: Cardiology

## 2012-03-19 ENCOUNTER — Other Ambulatory Visit: Payer: Self-pay | Admitting: Cardiology

## 2012-04-30 ENCOUNTER — Telehealth: Payer: Self-pay | Admitting: Pulmonary Disease

## 2012-04-30 NOTE — Telephone Encounter (Signed)
I spoke with pt. He stated his cough is not getting better. He is scheduled to see KC at 9 am 05/01/12. Nothing further was needed

## 2012-05-01 ENCOUNTER — Ambulatory Visit (INDEPENDENT_AMBULATORY_CARE_PROVIDER_SITE_OTHER): Payer: 59 | Admitting: Pulmonary Disease

## 2012-05-01 ENCOUNTER — Encounter: Payer: Self-pay | Admitting: Pulmonary Disease

## 2012-05-01 VITALS — BP 120/80 | HR 79 | Temp 97.6°F | Ht 67.5 in | Wt 204.2 lb

## 2012-05-01 DIAGNOSIS — R05 Cough: Secondary | ICD-10-CM

## 2012-05-01 DIAGNOSIS — R059 Cough, unspecified: Secondary | ICD-10-CM

## 2012-05-01 DIAGNOSIS — R053 Chronic cough: Secondary | ICD-10-CM

## 2012-05-01 NOTE — Patient Instructions (Addendum)
Stay on your acid reflux medication Will give you a prescription for Qnasl to use 2 sprays each nostril each am Take your chlorpheniramine as needed (antihistamine) followup with me in one year if doing well, but call if cough is not improving.

## 2012-05-01 NOTE — Assessment & Plan Note (Signed)
The pt had resolution of his cough while on topical nasal ICS.  I have asked him to get back on this, and to take his antihistamine as needed. Would also stay on once a day PPI.

## 2012-05-01 NOTE — Progress Notes (Signed)
  Subjective:    Patient ID: Edward Greene, male    DOB: 06-03-1960, 52 y.o.   MRN: [id number]  HPI The patient comes in today for followup of his cough.  He is felt to have an upper airway cough, and has responded very well in the past for treatment of reflux and postnasal drip.  He recently ran out of his topical nasal corticosteroid spray, and saw a recurrence of his cough with classic upper airway pseudo-wheezing.  He felt that he did very well when on his prior regimen.   Review of Systems  Constitutional: Negative for fever and unexpected weight change.  HENT: Positive for sore throat. Negative for ear pain, nosebleeds, congestion, rhinorrhea, sneezing, trouble swallowing, dental problem, postnasal drip and sinus pressure.   Eyes: Negative for redness and itching.  Respiratory: Positive for cough and wheezing. Negative for chest tightness and shortness of breath.   Cardiovascular: Negative for palpitations and leg swelling.  Gastrointestinal: Negative for nausea and vomiting.  Genitourinary: Negative for dysuria.  Musculoskeletal: Negative for joint swelling.  Skin: Negative for rash.  Neurological: Negative for headaches.  Hematological: Does not bruise/bleed easily.  Psychiatric/Behavioral: Negative for dysphoric mood. The patient is not nervous/anxious.        Objective:   Physical Exam Well-developed male in no acute distress Nose without purulence or discharge noted Neck without lymphadenopathy or thyromegaly Chest totally clear to auscultation, no wheezing Cardiac exam with regular rate and rhythm Lower extremities without edema, cyanosis Alert and oriented, moves all 4 extremities.       Assessment & Plan:

## 2012-05-12 ENCOUNTER — Other Ambulatory Visit: Payer: Self-pay | Admitting: Cardiology

## 2012-05-22 ENCOUNTER — Telehealth: Payer: Self-pay | Admitting: Pulmonary Disease

## 2012-05-25 ENCOUNTER — Telehealth: Payer: Self-pay | Admitting: Pulmonary Disease

## 2012-05-25 MED ORDER — BECLOMETHASONE DIPROPIONATE 80 MCG/ACT NA AERS: 2.0000 | INHALATION_SPRAY | NASAL | Status: DC

## 2012-05-25 NOTE — Telephone Encounter (Signed)
Spoke with pt to verify the msg I have refilled qnasal per his request  Nothing further needed per pt

## 2012-05-25 NOTE — Telephone Encounter (Signed)
Calling in ref to previous msg can be reached at [phone].Raylene Everts

## 2012-05-25 NOTE — Telephone Encounter (Signed)
Member ID #[id number]. Omeprazole APPROVED until 05/25/13. DX:  Chronic cough associated with GERD Case # [id number]. Pharmacy notified.

## 2012-06-11 ENCOUNTER — Other Ambulatory Visit: Payer: Self-pay | Admitting: Cardiology

## 2012-06-12 ENCOUNTER — Other Ambulatory Visit: Payer: Self-pay | Admitting: *Deleted

## 2012-06-12 MED ORDER — SPIRONOLACTONE 25 MG PO TABS: ORAL_TABLET | ORAL | Status: DC

## 2012-06-22 ENCOUNTER — Telehealth: Payer: Self-pay | Admitting: Pulmonary Disease

## 2012-06-22 NOTE — Telephone Encounter (Signed)
Received a fax from [facility] for a PA for Qnasl. Contact # [phone]. Pt ID# [id number]. I initiated PA through [email or web address]. I will forward message to Ashtyn to follow-up on approval/denial. Carron Curie, CMA

## 2012-06-25 ENCOUNTER — Other Ambulatory Visit: Payer: Self-pay | Admitting: Cardiology

## 2012-06-26 ENCOUNTER — Other Ambulatory Visit: Payer: Self-pay | Admitting: *Deleted

## 2012-06-26 DIAGNOSIS — I5022 Chronic systolic (congestive) heart failure: Secondary | ICD-10-CM

## 2012-06-26 MED ORDER — ISOSORB DINITRATE-HYDRALAZINE 20-37.5 MG PO TABS: 0.5000 | ORAL_TABLET | Freq: Three times a day (TID) | ORAL | Status: DC

## 2012-06-26 MED ORDER — ATORVASTATIN CALCIUM 20 MG PO TABS: 20.0000 mg | ORAL_TABLET | Freq: Every day | ORAL | Status: DC

## 2012-06-26 MED ORDER — CARVEDILOL 25 MG PO TABS: 25.0000 mg | ORAL_TABLET | Freq: Two times a day (BID) | ORAL | Status: DC

## 2012-06-26 NOTE — Telephone Encounter (Signed)
Qnasl has been approved from 05/23/12 through 06/22/13 via [employer]. Pt is aware.

## 2012-08-05 ENCOUNTER — Other Ambulatory Visit: Payer: Self-pay | Admitting: Pulmonary Disease

## 2012-08-05 MED ORDER — OMEPRAZOLE 40 MG PO CPDR: 40.0000 mg | DELAYED_RELEASE_CAPSULE | ORAL | Status: DC

## 2012-08-26 ENCOUNTER — Other Ambulatory Visit: Payer: Self-pay | Admitting: Pulmonary Disease

## 2012-08-29 ENCOUNTER — Other Ambulatory Visit: Payer: Self-pay | Admitting: Cardiology

## 2012-08-31 NOTE — Telephone Encounter (Signed)
REFILL -- Pt is requiring a refill on Diovan/ At one point in time pt was concerned about co-pay. I left pt a message on cell phone to be sure pt wanted to obtain a refill

## 2012-09-01 ENCOUNTER — Other Ambulatory Visit: Payer: Self-pay | Admitting: *Deleted

## 2012-09-01 DIAGNOSIS — I5022 Chronic systolic (congestive) heart failure: Secondary | ICD-10-CM

## 2012-09-01 MED ORDER — VALSARTAN 80 MG PO TABS: 160.0000 mg | ORAL_TABLET | Freq: Two times a day (BID) | ORAL | Status: DC

## 2012-09-08 ENCOUNTER — Other Ambulatory Visit: Payer: Self-pay | Admitting: *Deleted

## 2012-09-08 MED ORDER — ATORVASTATIN CALCIUM 20 MG PO TABS: 20.0000 mg | ORAL_TABLET | Freq: Every day | ORAL | Status: DC

## 2012-09-26 ENCOUNTER — Other Ambulatory Visit: Payer: Self-pay | Admitting: Cardiology

## 2012-10-28 ENCOUNTER — Other Ambulatory Visit: Payer: Self-pay | Admitting: Cardiology

## 2012-12-29 ENCOUNTER — Other Ambulatory Visit: Payer: Self-pay | Admitting: Cardiology

## 2013-03-01 ENCOUNTER — Other Ambulatory Visit: Payer: Self-pay | Admitting: Cardiovascular Disease

## 2013-03-01 ENCOUNTER — Other Ambulatory Visit: Payer: Self-pay | Admitting: Cardiology

## 2013-03-16 ENCOUNTER — Other Ambulatory Visit: Payer: Self-pay | Admitting: Cardiology

## 2013-03-16 ENCOUNTER — Other Ambulatory Visit: Payer: Self-pay | Admitting: Cardiovascular Disease

## 2013-03-29 ENCOUNTER — Ambulatory Visit (INDEPENDENT_AMBULATORY_CARE_PROVIDER_SITE_OTHER): Payer: 59 | Admitting: Cardiology

## 2013-03-29 ENCOUNTER — Encounter: Payer: Self-pay | Admitting: Cardiology

## 2013-03-29 VITALS — BP 140/98 | HR 82 | Ht 67.5 in | Wt 203.0 lb

## 2013-03-29 DIAGNOSIS — I429 Cardiomyopathy, unspecified: Secondary | ICD-10-CM

## 2013-03-29 DIAGNOSIS — I5022 Chronic systolic (congestive) heart failure: Secondary | ICD-10-CM

## 2013-03-29 DIAGNOSIS — E785 Hyperlipidemia, unspecified: Secondary | ICD-10-CM

## 2013-03-29 LAB — CBC WITH DIFFERENTIAL/PLATELET
Basophils Absolute: 0 10*3/uL (ref 0.0–0.1)
Eosinophils Relative: 1.2 % (ref 0.0–5.0)
HCT: 43.1 % (ref 39.0–52.0)
Hemoglobin: 13.8 g/dL (ref 13.0–17.0)
Lymphs Abs: 1.6 10*3/uL (ref 0.7–4.0)
MCV: 86.8 fl (ref 78.0–100.0)
Monocytes Absolute: 0.4 10*3/uL (ref 0.1–1.0)
Monocytes Relative: 7.6 % (ref 3.0–12.0)
Neutro Abs: 2.8 10*3/uL (ref 1.4–7.7)
Platelets: 166 10*3/uL (ref 150.0–400.0)
RDW: 13.5 % (ref 11.5–14.6)

## 2013-03-29 MED ORDER — ATORVASTATIN CALCIUM 20 MG PO TABS: ORAL_TABLET | ORAL | Status: DC

## 2013-03-29 MED ORDER — SPIRONOLACTONE 25 MG PO TABS: ORAL_TABLET | ORAL | Status: DC

## 2013-03-29 MED ORDER — ISOSORB DINITRATE-HYDRALAZINE 20-37.5 MG PO TABS: ORAL_TABLET | ORAL | Status: DC

## 2013-03-29 MED ORDER — CARVEDILOL 25 MG PO TABS: ORAL_TABLET | ORAL | Status: DC

## 2013-03-29 MED ORDER — VALSARTAN 80 MG PO TABS: 160.0000 mg | ORAL_TABLET | Freq: Two times a day (BID) | ORAL | Status: DC

## 2013-03-29 NOTE — Progress Notes (Signed)
Patient ID: Edward Greene, male   DOB: Apr 18, 1960, 52 y.o.   MRN: [id number] 52 yo with minimal past history presented initially after hospitalization with acute systolic CHF.  In 9/12, patient began to notice dyspnea with heavy exertion, such as when he carried something up a flight of steps. He also was having difficulty sleeping with symptoms that sounded consistent with orthopnea.  No chest pain.  He went to urgent care for evaluation, and CXR showed pulmonary edema.  BNP was elevated and ECG was abnormal.  He was sent to the ER.  Evaluation showed CHF with volume overload.  Echo showed EF 15% with moderate to severe LV dilation and multiple wall motion abnormalities.  Cardiac enzymes were negative.  Patient was diuresed and started on cardiac meds.  Left heart cath in 9/12 showed no angiographic CAD.  Cardiac MRI (9/12) showed EF 15%, moderate to severe LV dilation, mild RV dysfunction, and no delayed enhancement.  Cardiopulmonary exercise test, however, showed no significant functional limitation with VO2 max 33.6 and normal VE/VCO2 slope.  Echo 3/13 showed EF 25-30%.  Patient has been lost to followup since that time.   Symptomatically, he has been stable.  He has chronic fatigue but also works 2 jobs, starting at 5 am and not finishing until the late evening.  He feels much less fatigued when he is on vacation from one of the jobs.  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  No significant exertional dyspnea.  No orthopnea, bendopnea, PND, or chest pain.  Besides the fatigue, he feels good.  He has not had recent labs. He is still taking all his medications.   Labs (9/12): K 4.3, creatinine 1.7 => 1.6, pro-BNP 907=>218, SPEP negative, HIV negative, transferrin saturation 47%, LDL 145, HDL 33 Labs (10/12): K 3.5, creatinine 1.4, LDL 55, HDL 38, BNP 81 Labs (11/12): K 4, creatinine 1.4  ECG: NSR, inferior and anterolateral  TWIs, QTc 474 msec  PMH: 1. Childhood asthma 2. Hyperlipidemia 3. Systolic CHF: Echo (9/12) with EF 15%, moderate-severe LV dilation, inferior/posterior/inferoseptal/apical akinesis, septal/anterolateral severe hypokinesis, mild to moderate RV dilation with normal RV systolic function.  HIV negative, SPEP negative.  No history of ETOH or drugs.  No history of HTN.  LHC (9/12) with no angiographic CAD. Possible viral myocarditis versus familial cardiomyopathy.  Cardiac MRI (9/12) with EF 15%, moderate to severe LV dilation, mild RV dysfunction, no myocardial delayed enhancement.  Echo (1/13): EF 15%, diffuse HK, grade I diastolic dysfunction.  CPX (1/13): VO2 max 33.6, VE/VCO2 normal.  Echo (3/13): EF 25-30%, global hypokinesis, normal RV.  4. ACEI cough  FH: Father with CHF in his 32s, thinks he had an MI.  Father has PCM.  Father had 9 brothers, all had MIs or CHF.  Sister with MI in her 34s.   SH: Lives in [place] with his wife.  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  No tobacco, no ETOH, no no drugs.   ROS: All systems reviewed and negative except as per HPI.   Current Outpatient Prescriptions  Medication Sig Dispense Refill  . atorvastatin (LIPITOR) 20 MG tablet TAKE 1 TABLET BY MOUTH DAILY  30 tablet  11  . carvedilol (COREG) 25 MG tablet TAKE 1 TABLET BY MOUTH TWICE DAILY WITH A MEAL  60 tablet  11  . isosorbide-hydrALAZINE (BIDIL) 20-37.5 MG per tablet  TAKE 1/2 TABLET BY MOUTH THREE TIMES DAILY  45 tablet  11  . omeprazole (PRILOSEC) 40 MG capsule TAKE 1 CAPSULE BY MOUTH EVERY MORNING  30 capsule  11  . spironolactone (ALDACTONE) 25 MG tablet TAKE 1 TABLET BY MOUTH EVERY DAY  30 tablet  11  . valsartan (DIOVAN) 80 MG tablet Take 2 tablets (160 mg total) by mouth 2 (two) times daily.  120 tablet  11   No current facility-administered medications for this visit.    BP 140/98  Pulse 82  Ht 5' 7.5" (1.715 m)  Wt 92.08 kg (203 lb)  BMI 31.31  kg/m2 General: NAD Neck: No JVD, no thyromegaly or thyroid nodule.  Lungs: Clear to auscultation bilaterally with normal respiratory effort. CV: Nondisplaced PMI.  Heart regular S1/S2, no S3/S4, no murmur.  No peripheral edema.  No carotid bruit.  Normal pedal pulses.  Abdomen: Soft, nontender, no hepatosplenomegaly, no distention.  Neurologic: Alert and oriented x 3.  Psych: Normal affect. Extremities: No clubbing or cyanosis.   Assessment/Plan: 1. Chronic systolic CHF: Nonischemic cardiomyopathy.  ?Viral myocarditis versus familial cardiomyopathy.  He is currently NYHA class I probably.  He is taking all his meds though he has not followed up in > 1 year.  - Repeat echo to reassess EF.  If he is truly NYHA class I, would not qualify for ICD with EF < 35%.  - Continue Bidil, Coreg, spironolactone, and valsartan at current doses.  - BMET today.  If he is to continue spironolactone, he will need q 3 month BMETs.  He agrees to this.  2. Hyperlipidemia: Check lipids today.   Followup 6 months.   Marca Ancona 03/29/2013

## 2013-03-29 NOTE — Patient Instructions (Addendum)
Your physician recommends that you continue on your current medications as directed. Please refer to the Current Medication list given to you today. Your medications have been refilled today  Your physician has requested that you have an echocardiogram. Echocardiography is a painless test that uses sound waves to create images of your heart. It provides your doctor with information about the size and shape of your heart and how well your heart's chambers and valves are working. This procedure takes approximately one hour. There are no restrictions for this procedure.  Labs today: bmet,cbc,lipid,hepatic...  Your physician recommends that you return for lab work in: every 3 month bmet  Your physician wants you to follow-up in: 6 months at the CHF clinic You will receive a reminder letter in the mail two months in advance. If you don't receive a letter, please call our office to schedule the follow-up appointment.

## 2013-04-14 ENCOUNTER — Encounter: Payer: Self-pay | Admitting: Cardiology

## 2013-04-14 ENCOUNTER — Other Ambulatory Visit: Payer: Self-pay | Admitting: Cardiovascular Disease

## 2013-04-14 ENCOUNTER — Ambulatory Visit (HOSPITAL_COMMUNITY): Payer: 59 | Attending: Cardiology | Admitting: Cardiology

## 2013-04-14 DIAGNOSIS — I5022 Chronic systolic (congestive) heart failure: Secondary | ICD-10-CM

## 2013-04-14 DIAGNOSIS — I509 Heart failure, unspecified: Secondary | ICD-10-CM | POA: Insufficient documentation

## 2013-04-14 DIAGNOSIS — I429 Cardiomyopathy, unspecified: Secondary | ICD-10-CM | POA: Insufficient documentation

## 2013-04-14 DIAGNOSIS — E785 Hyperlipidemia, unspecified: Secondary | ICD-10-CM | POA: Insufficient documentation

## 2013-04-14 NOTE — Progress Notes (Signed)
Echo performed. 

## 2013-04-21 ENCOUNTER — Other Ambulatory Visit: Payer: Self-pay | Admitting: *Deleted

## 2013-04-21 DIAGNOSIS — I5022 Chronic systolic (congestive) heart failure: Secondary | ICD-10-CM

## 2013-05-03 ENCOUNTER — Ambulatory Visit (HOSPITAL_COMMUNITY): Payer: 59 | Attending: Cardiology

## 2013-05-03 DIAGNOSIS — I5022 Chronic systolic (congestive) heart failure: Secondary | ICD-10-CM

## 2013-05-03 DIAGNOSIS — I429 Cardiomyopathy, unspecified: Secondary | ICD-10-CM | POA: Insufficient documentation

## 2013-05-10 ENCOUNTER — Telehealth: Payer: Self-pay | Admitting: Cardiology

## 2013-05-10 NOTE — Telephone Encounter (Signed)
Spoke with patient about recent CPX results. 

## 2013-05-10 NOTE — Telephone Encounter (Signed)
New problem   Pt stated he was returning a call from Dr Shirlee Latch nurse.

## 2013-05-10 NOTE — Progress Notes (Signed)
Pt.notified

## 2013-06-16 ENCOUNTER — Other Ambulatory Visit: Payer: Self-pay | Admitting: Pulmonary Disease

## 2013-06-23 ENCOUNTER — Other Ambulatory Visit (INDEPENDENT_AMBULATORY_CARE_PROVIDER_SITE_OTHER): Payer: 59

## 2013-06-23 DIAGNOSIS — I5022 Chronic systolic (congestive) heart failure: Secondary | ICD-10-CM

## 2013-06-23 LAB — BASIC METABOLIC PANEL
BUN: 13 mg/dL (ref 6–23)
CO2: 27 mEq/L (ref 19–32)
CREATININE: 1.6 mg/dL — AB (ref 0.4–1.5)
Calcium: 9.6 mg/dL (ref 8.4–10.5)
Chloride: 106 mEq/L (ref 96–112)
GFR: 60.56 mL/min (ref >60.00)
GLUCOSE: 114 mg/dL — AB (ref 70–99)
Potassium: 3.9 mEq/L (ref 3.5–5.1)
Sodium: 140 mEq/L (ref 135–145)

## 2013-06-25 ENCOUNTER — Telehealth: Payer: Self-pay | Admitting: Cardiology

## 2013-06-25 NOTE — Telephone Encounter (Signed)
lmtcb Debbie Heiress Williamson RN  

## 2013-06-25 NOTE — Telephone Encounter (Signed)
New problem   Pt need to know when does he need to come back to see Dr Shirlee Latch and what next steps he need to take concerning his care. Pt want to talk to nurse

## 2013-06-29 NOTE — Telephone Encounter (Signed)
According to Dr Alford Highland notes - pt is to follow up in 6 months in the CHF clinic. (due 08/2013) left a message for pt to call to schedule an appointment.

## 2013-07-16 NOTE — Telephone Encounter (Signed)
June sch incomplete, pt in our recall list

## 2013-08-08 IMAGING — CT CT ANGIO CHEST
2 of 7 series · 19 of 46 positions shown · IV contrast (APPLIED)
Comparison: None.

CLINICAL DATA: Progressive shortness of breath.  Elevated D-dimer.

CT ANGIOGRAPHY CHEST WITH CONTRAST
TECHNIQUE: Multidetector CT imaging of the chest was performed
using the standard protocol during bolus administration of
intravenous contrast.  Multiplanar CT image reconstructions
including MIPs were obtained to evaluate the vascular anatomy.
Contrast:  100 ml Omnipaque 300

[Series 8: pulm embolism 1.0 b25f thin · axial · 0.70mm/px · z∈[-316,-20]mm · 16 of 332 slices shown]
[im 18/332  lung]
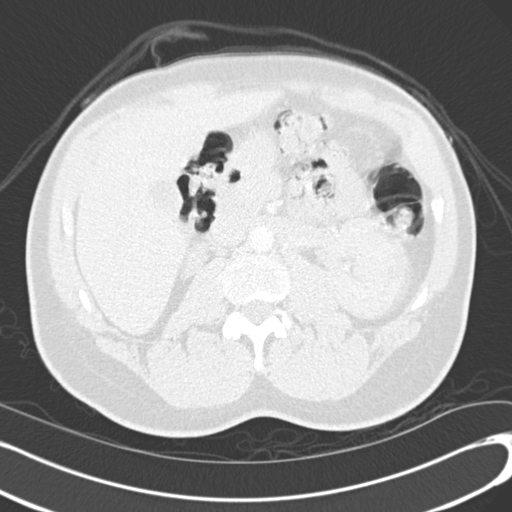
[im 35/332  soft-tissue]
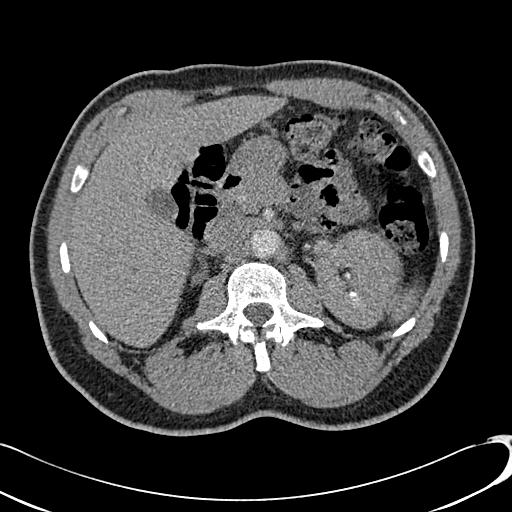
[im 53/332  lung]
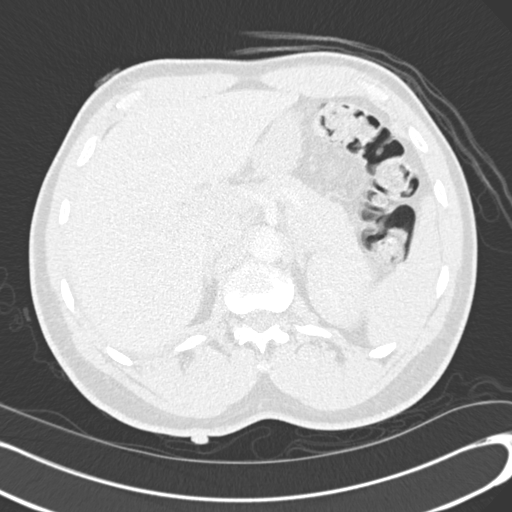
[im 70/332  soft-tissue]
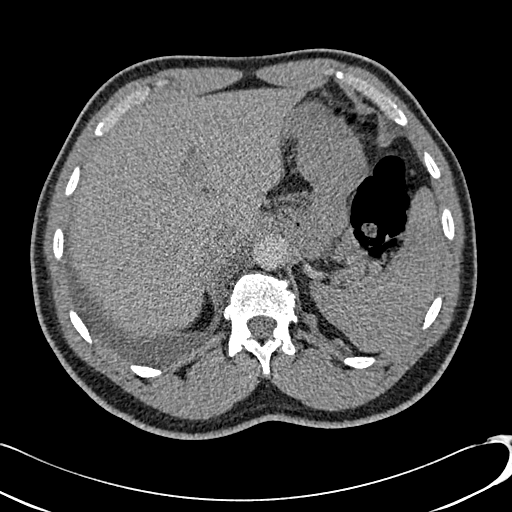
[im 105/332  lung]
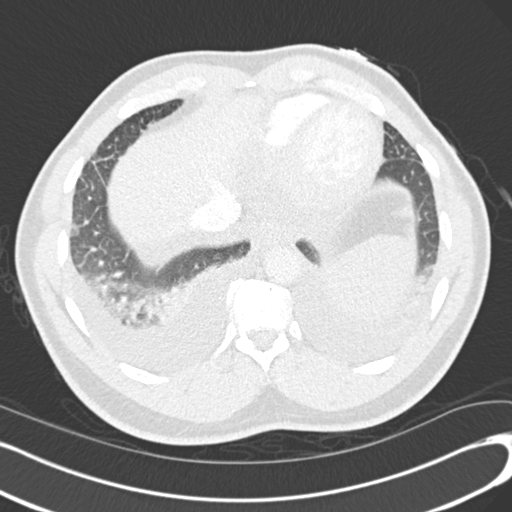
[im 122/332  soft-tissue]
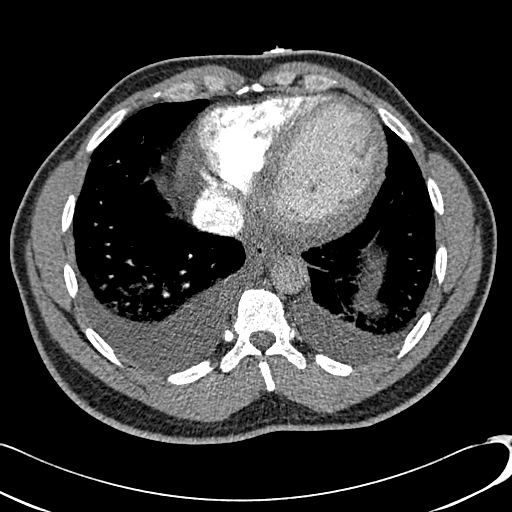
[im 140/332  lung]
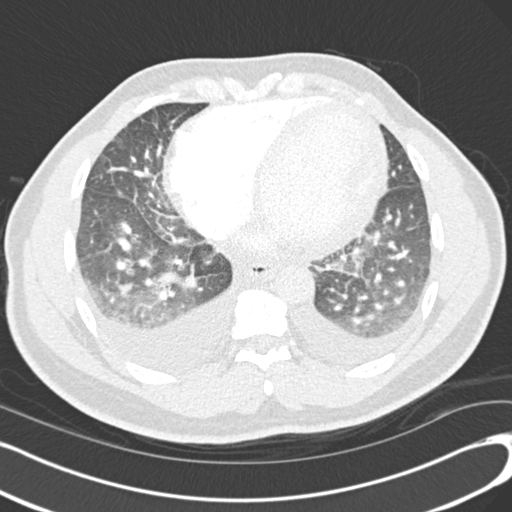
[im 157/332  soft-tissue]
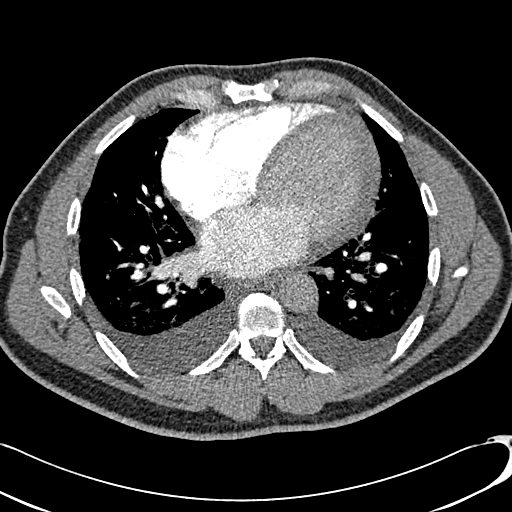
[im 175/332  lung]
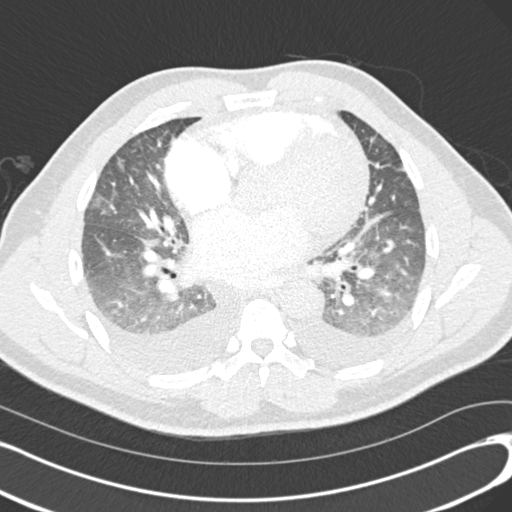
[im 192/332  soft-tissue]
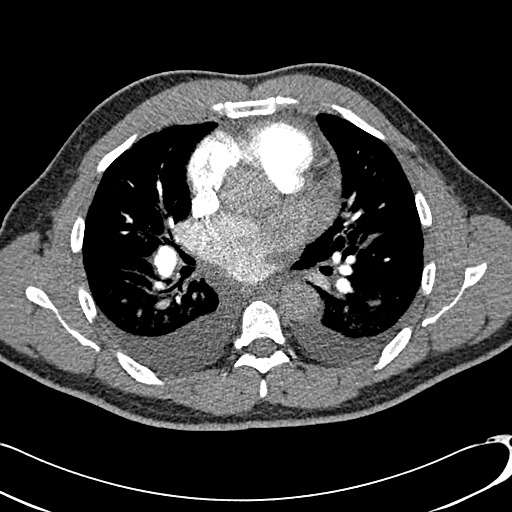
[im 210/332  lung]
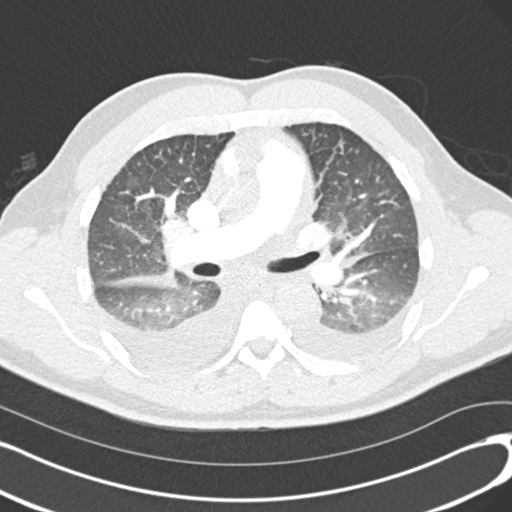
[im 227/332  soft-tissue]
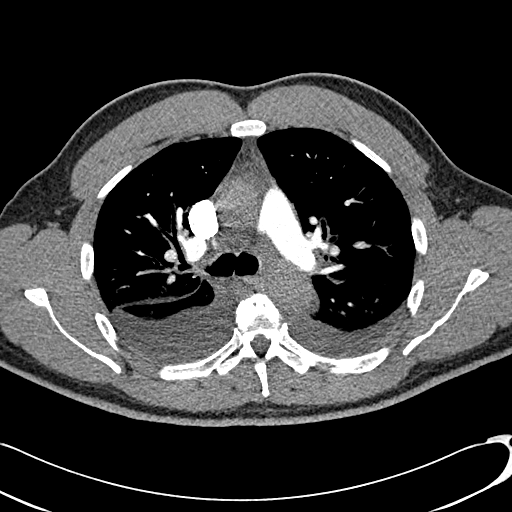
[im 262/332  lung]
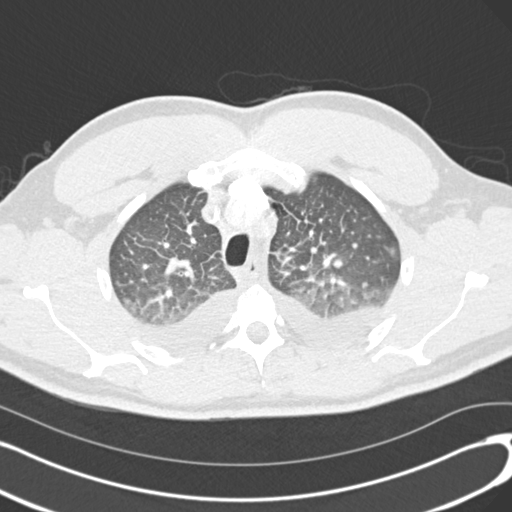
[im 279/332  soft-tissue]
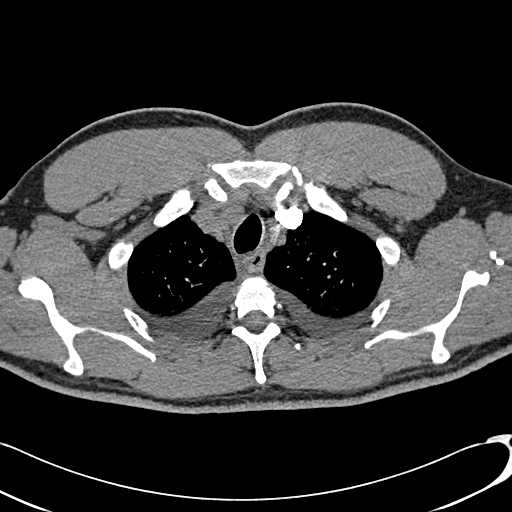
[im 297/332  lung]
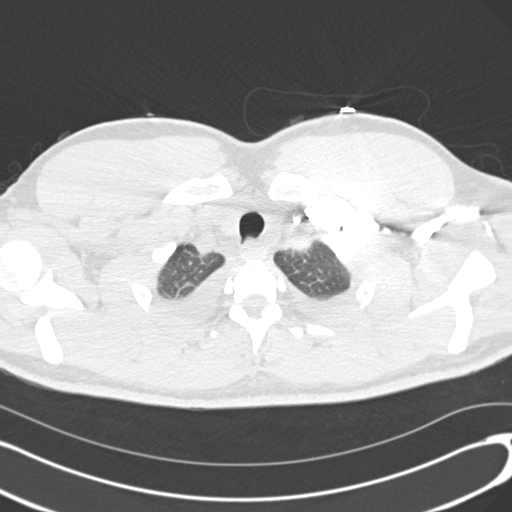
[im 314/332  soft-tissue]
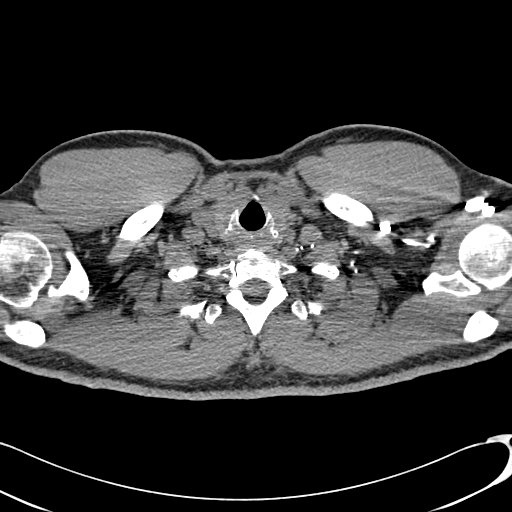

[Series 604: <mpr thick range> · coronal · 0.70mm/px · 3 of 123 slices shown]
[im 31/123  soft-tissue]
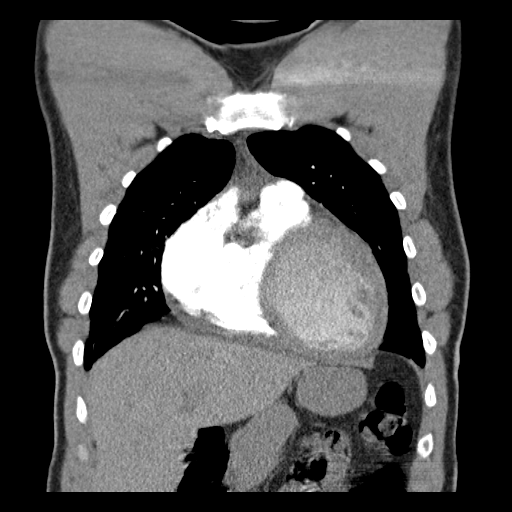
[im 62/123  soft-tissue]
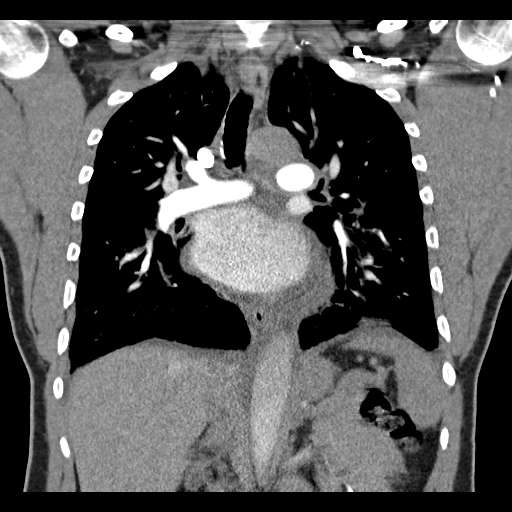
[im 92/123  soft-tissue]
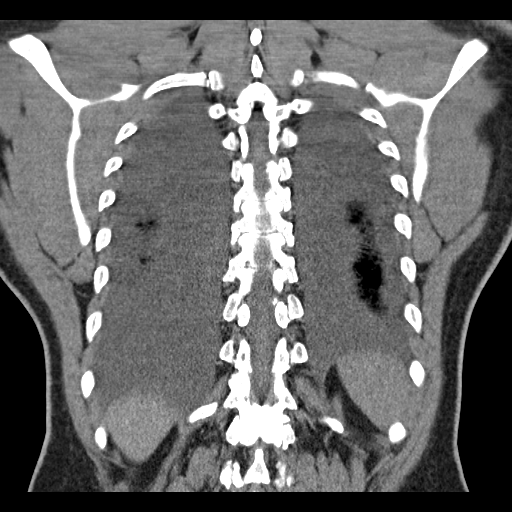

[19 of 46 positions shown; findings below may reference images not displayed]

FINDINGS: Technically adequate study with moderately good
opacification of the central and segmental pulmonary arteries.  No
focal filling defects are demonstrated.  No evidence of significant
pulmonary embolus.  Normal caliber thoracic aorta.  Cardiac
enlargement with reflux of contrast material into the hepatic veins
consistent with passive congestion.  Bilateral pleural effusions.
Hazy opacities in the lung bases consistent with mild edema.
Changes are most consistent with congestive failure or fluid
overload.  Small right adrenal gland nodule measuring less than 1
cm, likely representing adenoma although incompletely characterized
on this study.  Degenerative changes in the spine.

Review of the MIP images confirms the above findings.
IMPRESSION: No evidence of significant pulmonary embolus.  Passive hepatic
congestion.  Small bilateral pleural effusions with basilar edema.
Changes most consistent with congestive failure or fluid overload.

## 2013-08-08 IMAGING — CR DG CHEST 2V
2 series · 2 of 2 positions shown · non-contrast
Comparison: None.

CLINICAL DATA: Cough, short of breath, abnormal EKG, hypertension.

CHEST - 2 VIEW

[w chest pa]
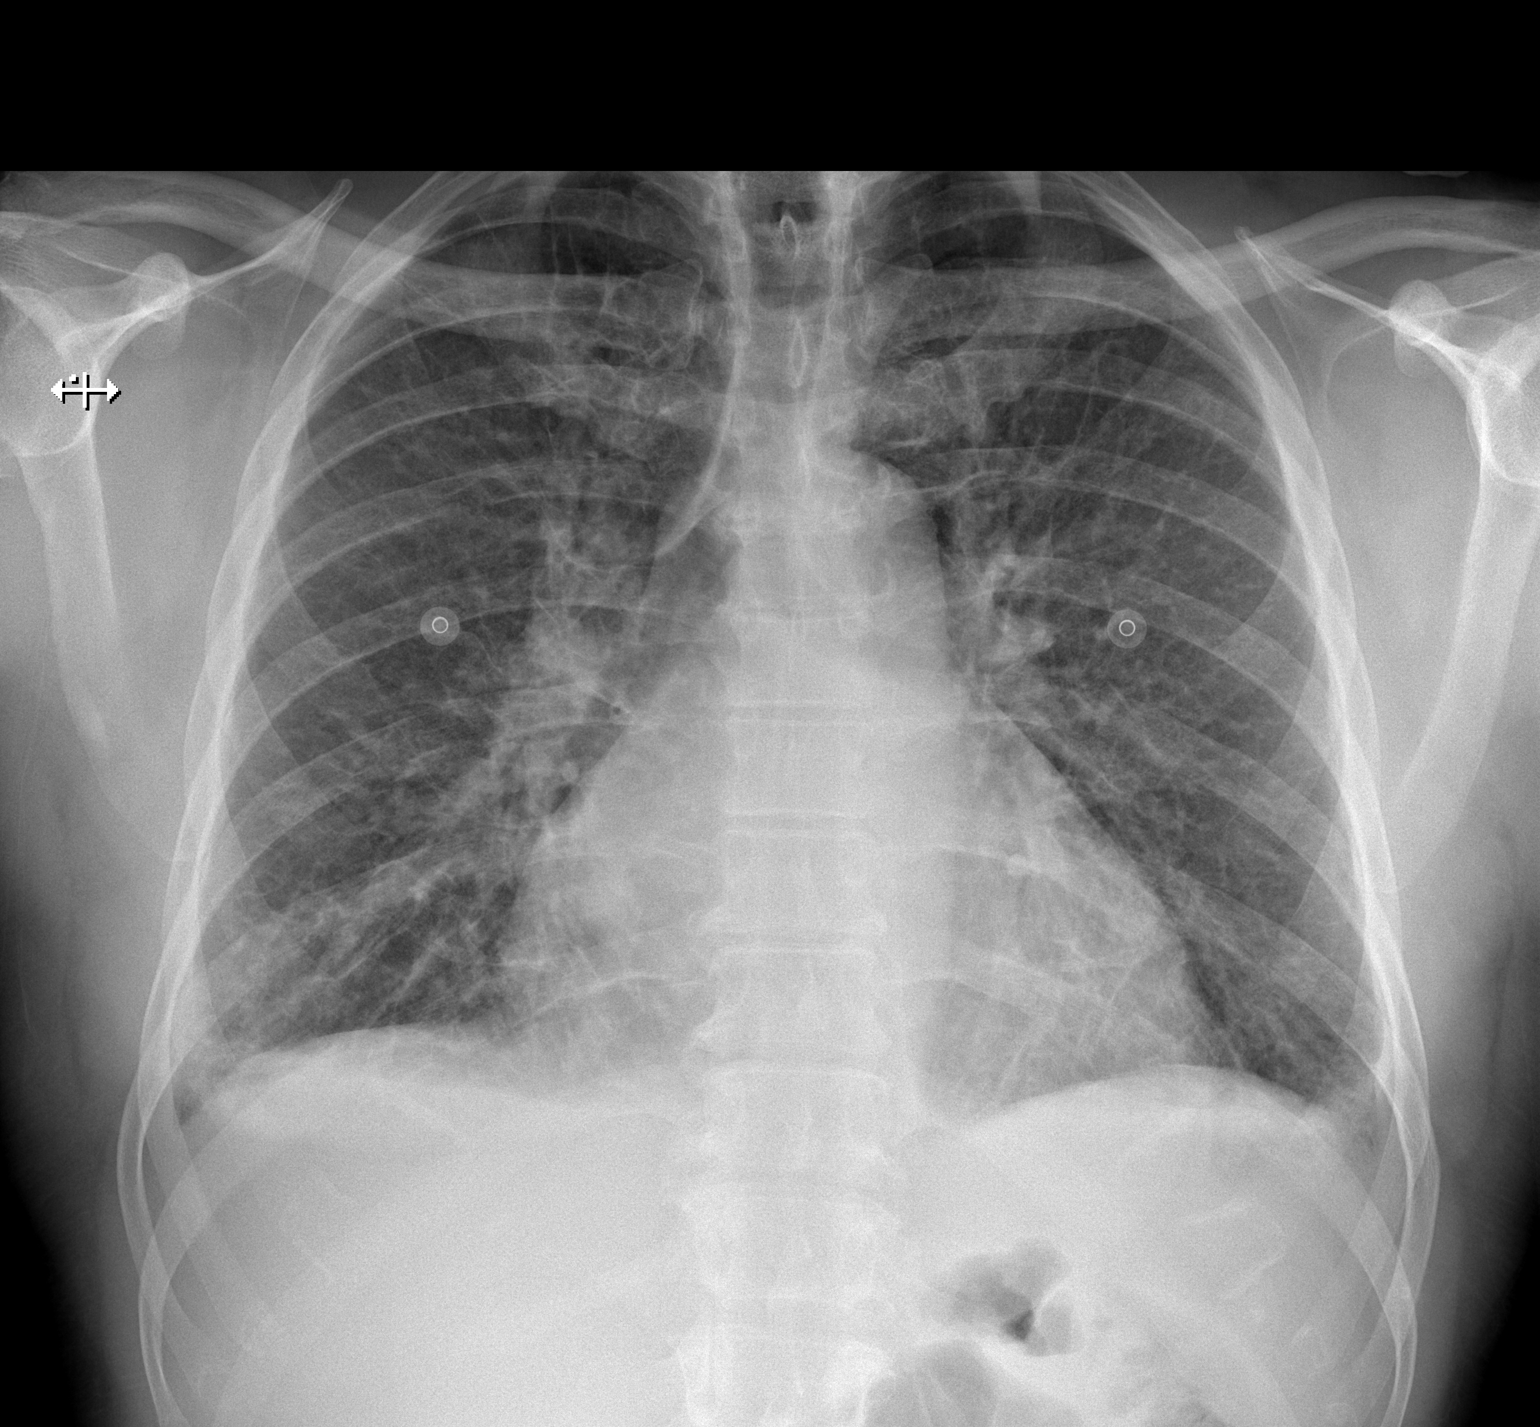

[w chest lat]
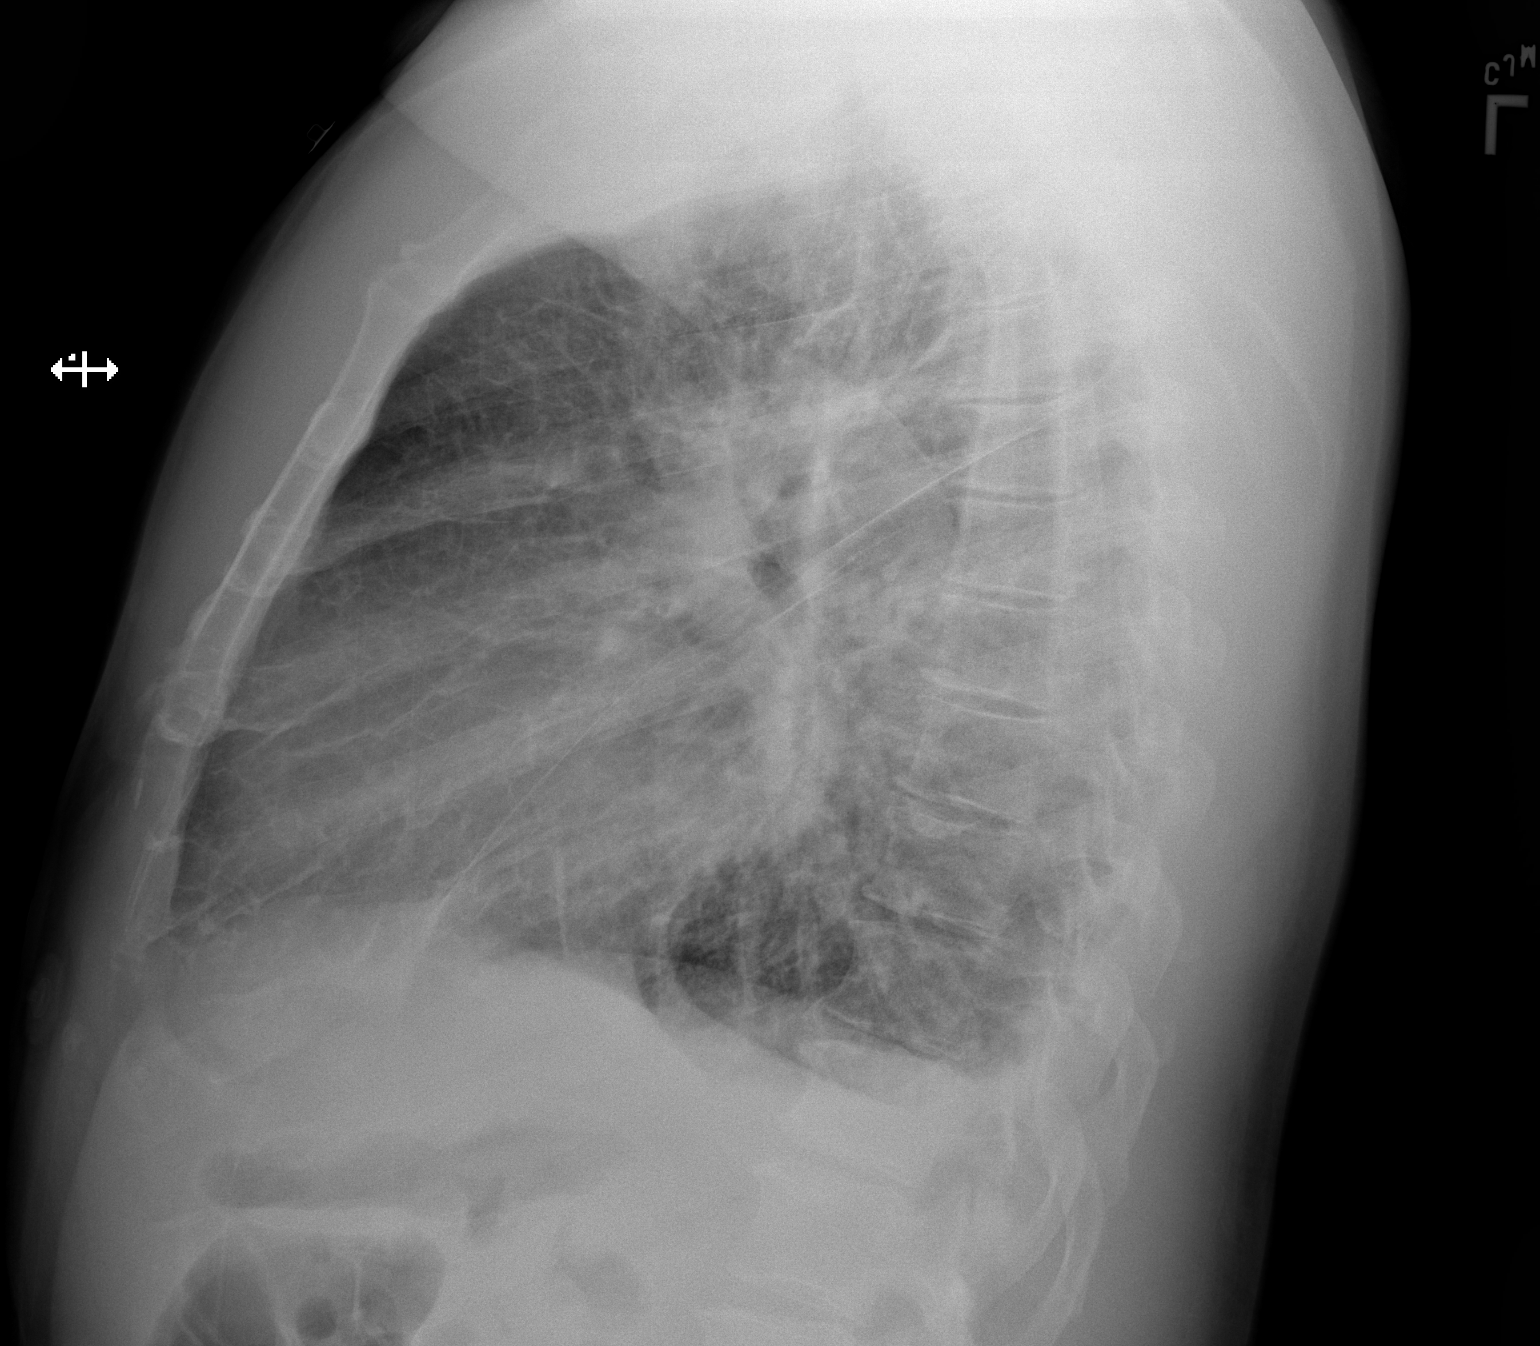

[2 of 2 positions shown; findings below may reference images not displayed]

FINDINGS: Cardiac enlargement with mild prominence of pulmonary
vascularity.  Interstitial changes in the lung bases could
represent early edema.  Small bilateral pleural effusions.  Changes
are most consistent with congestive failure or fluid overload.  No
focal airspace consolidation.  No pneumothorax.
IMPRESSION: Prominent heart size and pulmonary vascularity with mild
interstitial edema and small bilateral pleural effusions.

## 2013-08-11 IMAGING — CR DG CHEST 2V
2 series · 2 of 2 positions shown · non-contrast
Comparison: CT chest and chest radiograph 11/30/2010.

CLINICAL DATA: CHF.  Hypertension.

CHEST - 2 VIEW

[w chest pa]
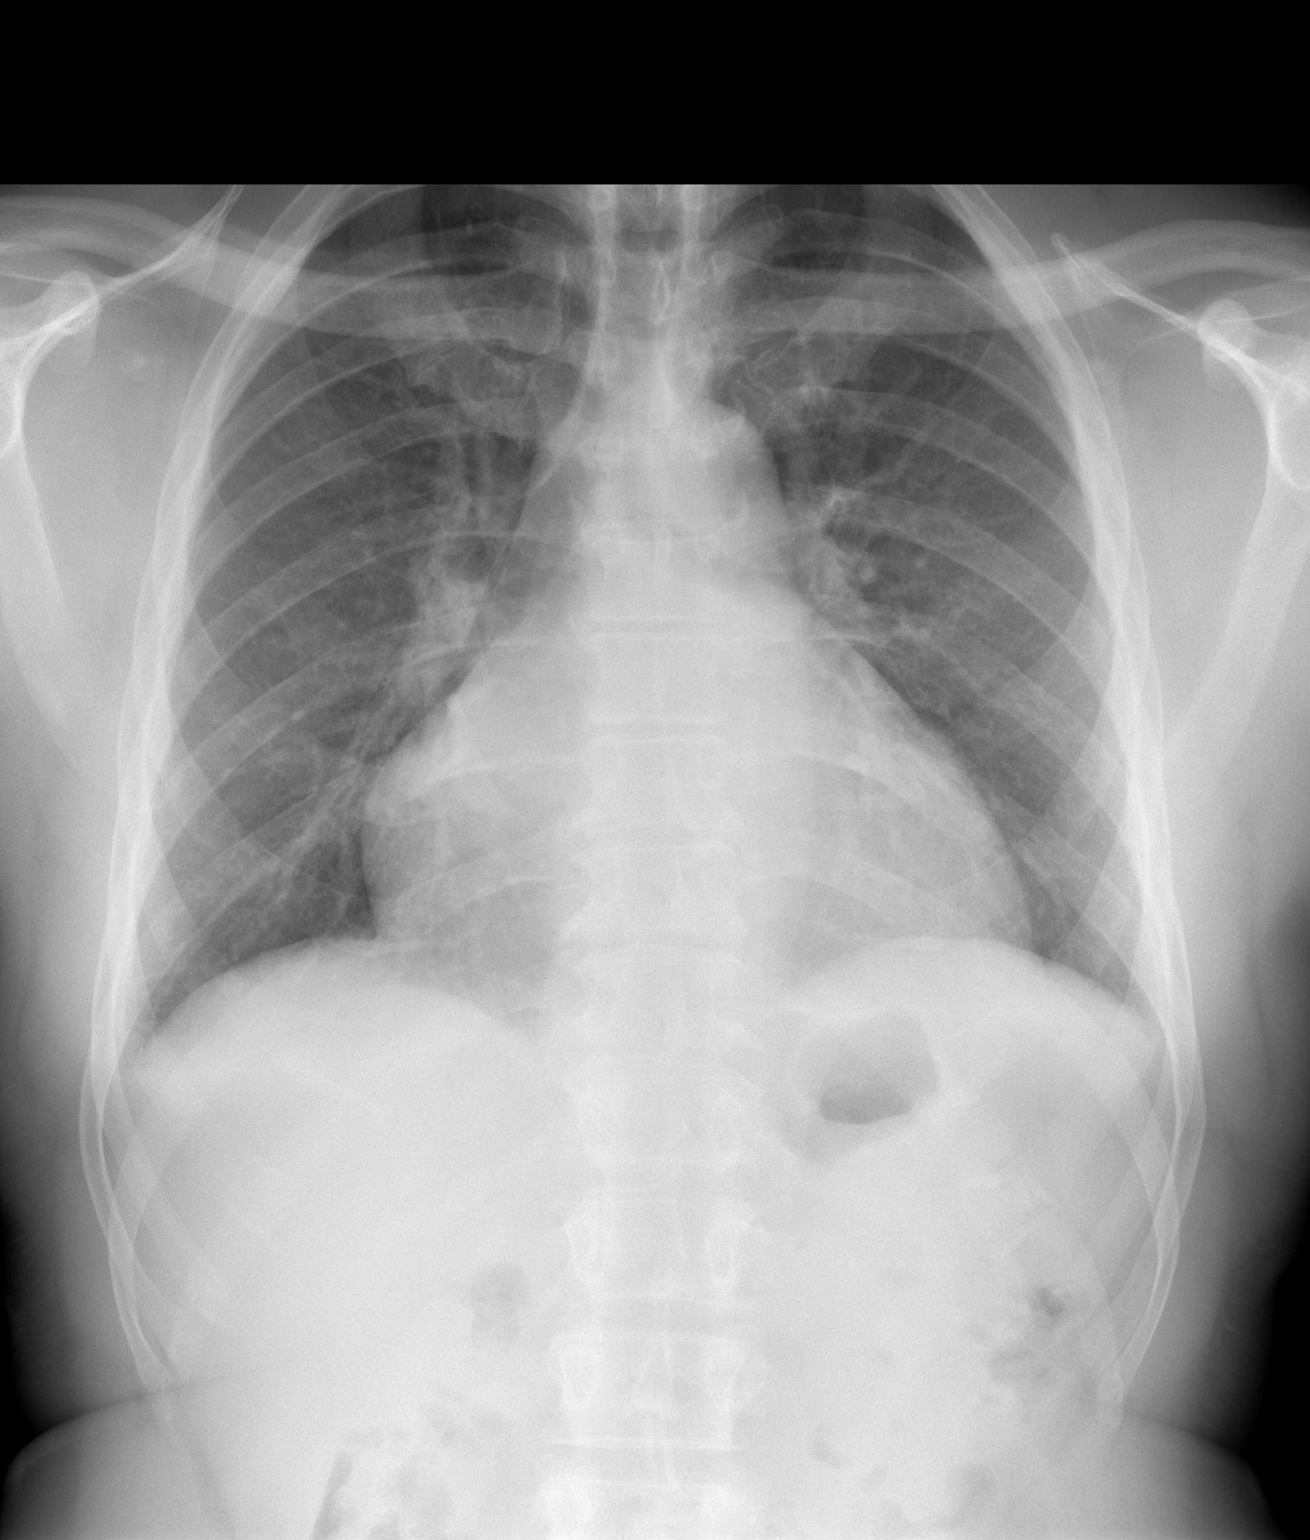

[w chest lat]
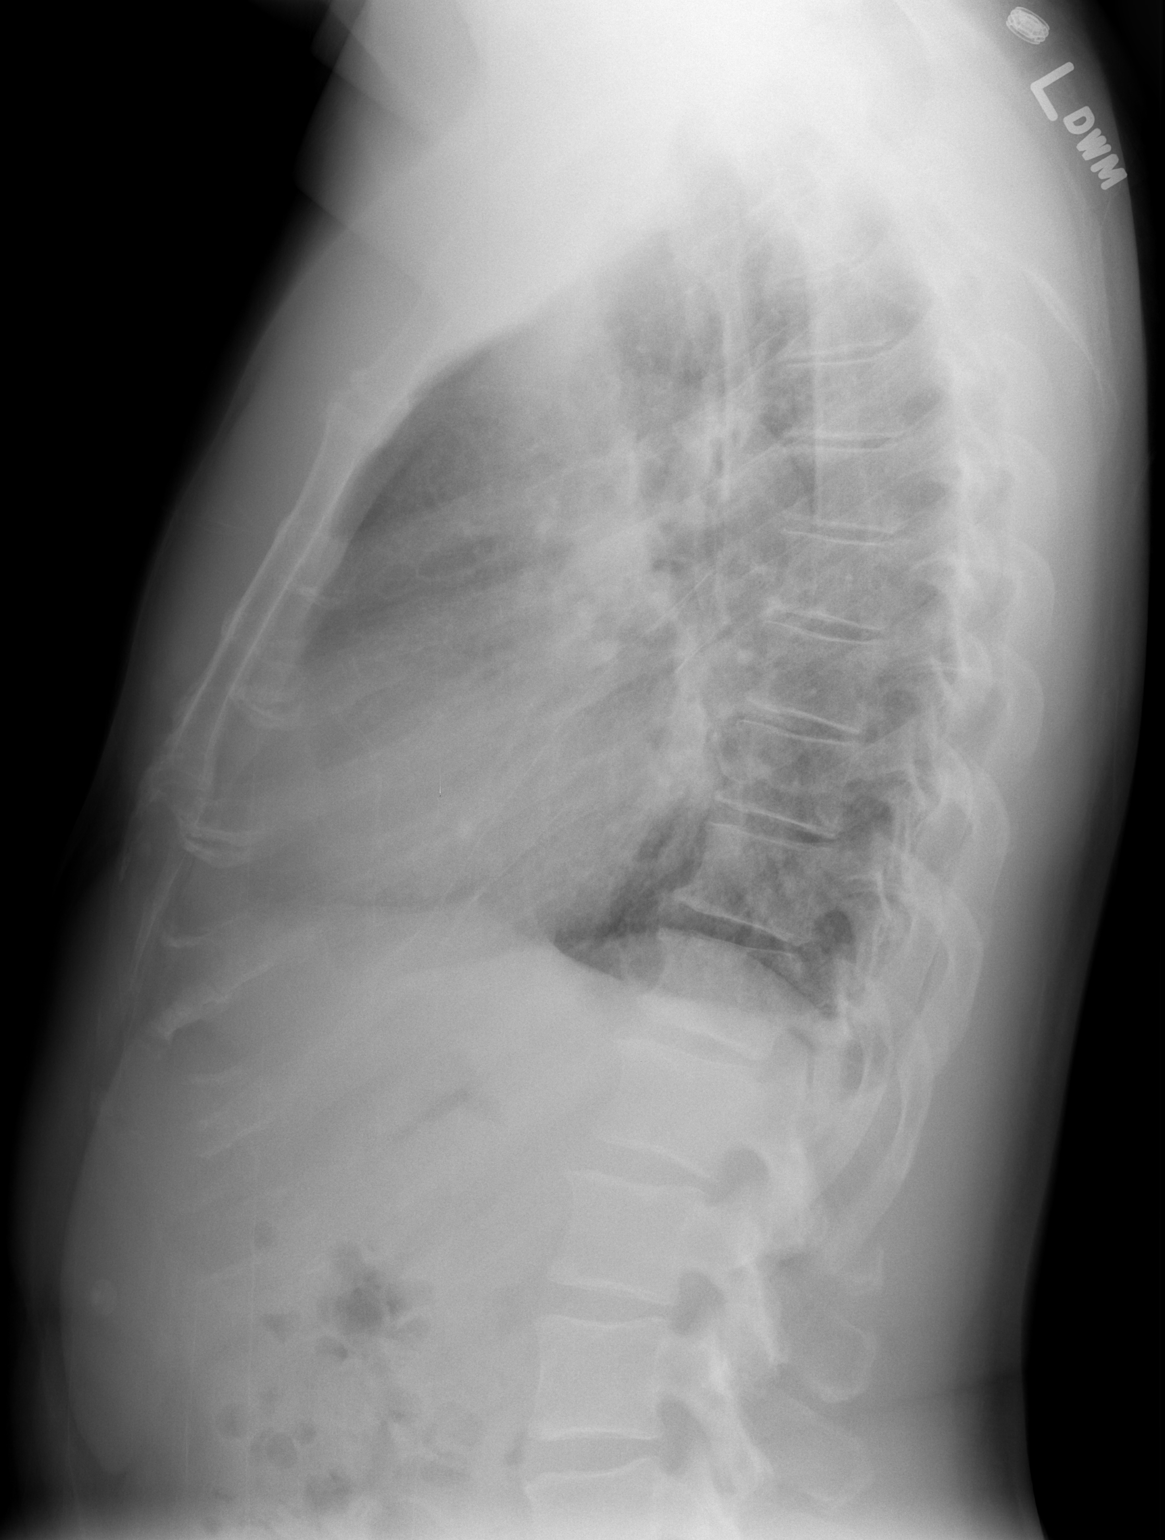

[2 of 2 positions shown; findings below may reference images not displayed]

FINDINGS: Trachea is midline.  Heart is enlarged.  There has been
interval improvement in interstitial prominence.  Tiny bilateral
pleural effusions persist.  Degenerative changes in the spine are
mild.
IMPRESSION: Improving congestive heart failure, without complete resolution.

## 2013-09-08 ENCOUNTER — Ambulatory Visit (HOSPITAL_COMMUNITY)
Admission: RE | Admit: 2013-09-08 | Discharge: 2013-09-08 | Disposition: A | Discharge status: Home / Self Care | Payer: 59 | Source: Ambulatory Visit | Attending: Internal Medicine | Admitting: Internal Medicine

## 2013-09-08 VITALS — BP 142/84 | HR 68 | Wt 205.8 lb

## 2013-09-08 DIAGNOSIS — R05 Cough: Secondary | ICD-10-CM | POA: Diagnosis not present

## 2013-09-08 DIAGNOSIS — R059 Cough, unspecified: Secondary | ICD-10-CM

## 2013-09-08 DIAGNOSIS — R062 Wheezing: Secondary | ICD-10-CM | POA: Insufficient documentation

## 2013-09-08 DIAGNOSIS — Z9861 Coronary angioplasty status: Secondary | ICD-10-CM | POA: Diagnosis not present

## 2013-09-08 DIAGNOSIS — Z87898 Personal history of other specified conditions: Secondary | ICD-10-CM | POA: Insufficient documentation

## 2013-09-08 DIAGNOSIS — R053 Chronic cough: Secondary | ICD-10-CM

## 2013-09-08 DIAGNOSIS — I428 Other cardiomyopathies: Secondary | ICD-10-CM | POA: Diagnosis not present

## 2013-09-08 DIAGNOSIS — N189 Chronic kidney disease, unspecified: Secondary | ICD-10-CM | POA: Insufficient documentation

## 2013-09-08 DIAGNOSIS — I5022 Chronic systolic (congestive) heart failure: Secondary | ICD-10-CM | POA: Insufficient documentation

## 2013-09-08 DIAGNOSIS — E785 Hyperlipidemia, unspecified: Secondary | ICD-10-CM | POA: Diagnosis not present

## 2013-09-08 LAB — BASIC METABOLIC PANEL
BUN: 16 mg/dL (ref 6–23)
CALCIUM: 9.6 mg/dL (ref 8.4–10.5)
CO2: 25 mEq/L (ref 19–32)
Chloride: 104 mEq/L (ref 96–112)
Creatinine, Ser: 1.45 mg/dL — ABNORMAL HIGH (ref 0.50–1.35)
GFR calc Af Amer: 62 mL/min — ABNORMAL LOW (ref >90)
GFR calc non Af Amer: 54 mL/min — ABNORMAL LOW (ref >90)
GLUCOSE: 124 mg/dL — AB (ref 70–99)
Potassium: 4.2 mEq/L (ref 3.7–5.3)
SODIUM: 142 meq/L (ref 137–147)

## 2013-09-08 LAB — LIPID PANEL
CHOL/HDL RATIO: 5 ratio
Cholesterol: 100 mg/dL (ref 0–200)
HDL: 20 mg/dL — AB (ref >39)
LDL CALC: 35 mg/dL (ref 0–99)
TRIGLYCERIDES: 227 mg/dL — AB (ref <150)
VLDL: 45 mg/dL — ABNORMAL HIGH (ref 0–40)

## 2013-09-08 LAB — PRO B NATRIURETIC PEPTIDE: PRO B NATRI PEPTIDE: 18.5 pg/mL (ref 0–125)

## 2013-09-08 MED ORDER — OMEPRAZOLE 20 MG PO CPDR: 20.0000 mg | DELAYED_RELEASE_CAPSULE | Freq: Every day | ORAL | Status: DC

## 2013-09-08 NOTE — Progress Notes (Signed)
Patient ID: Edward Greene, male   DOB: 1960-12-03, 53 y.o.   MRN: [id number] 53 yo with minimal past history presented initially after hospitalization with acute systolic CHF.  In 9/12, patient began to notice dyspnea with heavy exertion, such as when he carried something up a Greene of steps. He also was having difficulty sleeping with symptoms that sounded consistent with orthopnea.  No chest pain.  He went to urgent care for evaluation, and CXR showed pulmonary edema.  BNP was elevated and ECG was abnormal.  He was sent to the ER.  Evaluation showed CHF with volume overload.  Echo showed EF 15% with moderate to severe LV dilation and multiple wall motion abnormalities.  Cardiac enzymes were negative.  Patient was diuresed and started on cardiac meds.  Left heart cath in 9/12 showed no angiographic CAD.  Cardiac MRI (9/12) showed EF 15%, moderate to severe LV dilation, mild RV dysfunction, and no delayed enhancement.  Cardiopulmonary exercise test, however, showed no significant functional limitation with VO2 max 33.6 and normal VE/VCO2 slope.  Echo 3/13 showed EF 25-30%.  Last echo in 1/15 showed EF remained 25-30% with moderate LV dilation and global hypokinesis.  There were prominent apical trabeculations concerning for possilbe noncompaction, RV was normal.  CPX was repeated in 2/15.  Peak VO2 was good at 26 but lower than prior.  VE/VCO2 slope 27 with RER 1.26.   About a month ago, patient ran out of his meds for about 2 wks. He developed a cough and occasional wheezing.  He started back on his meds 2 wks ago but the cough and wheezing have not resolved. He has spells where he feels like it is hard to breathe, but these spells do not tend to be associated with exertion.  He is still very active, working at a gym training boxers.  He does some sparring still and uses the bags.  He rides a bike with no problems and has no dyspnea walking or going up stairs.  He does not feel that he has the stamina that he  used to have.   Labs (9/12): K 4.3, creatinine 1.7 => 1.6, pro-BNP 907=>218, SPEP negative, HIV negative, transferrin saturation 47%, LDL 145, HDL 33 Labs (10/12): K 3.5, creatinine 1.4, LDL 55, HDL 38, BNP 81 Labs (11/12): K 4, creatinine 1.4 Labs (3/15): K 3.9, creatinine 1.6  ECG: NSR, inferior and anterolateral TWIs, QTc 474 msec  PMH: 1. Childhood asthma 2. Hyperlipidemia 3. Systolic CHF: Echo (9/12) with EF 15%, moderate-severe LV dilation, inferior/posterior/inferoseptal/apical akinesis, septal/anterolateral severe hypokinesis, mild to moderate RV dilation with normal RV systolic function.  HIV negative, SPEP negative.  No history of ETOH or drugs.  No history of HTN.  LHC (9/12) with no angiographic CAD. Possible viral myocarditis versus familial cardiomyopathy.  Cardiac MRI (9/12) with EF 15%, moderate to severe LV dilation, mild RV dysfunction, no myocardial delayed enhancement.  Echo (1/13): EF 15%, diffuse HK, grade I diastolic dysfunction.  CPX (1/13): VO2 max 33.6, VE/VCO2 normal.  Echo (3/13): EF 25-30%, global hypokinesis, normal RV.  Echo (1/15): EF 25-30%, moderate LV dilation, global hypokinesis, prominent apical trabeculations, normal RV.  CPX (2/15): RER 1.26, peak VO2 26, VE/VCO2 27.  4. ACEI cough 5. CKD  FH: Father with CHF in his 39s, thinks he had an MI.  Father has PCM.  Father had 9 brothers, all had MIs or CHF.  Sister with MI in her 40s.   SH: Lives in [place] with his wife.  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  No tobacco, no ETOH, no no drugs.   ROS: All systems reviewed and negative except as per HPI.   Current Outpatient Prescriptions  Medication Sig Dispense Refill  . atorvastatin (LIPITOR) 20 MG tablet TAKE 1 TABLET BY MOUTH DAILY  30 tablet  11  . carvedilol (COREG) 25 MG tablet TAKE 1 TABLET BY MOUTH TWICE DAILY WITH A MEAL  60 tablet  11  . isosorbide-hydrALAZINE (BIDIL) 20-37.5 MG per tablet TAKE 1/2 TABLET BY MOUTH  THREE TIMES DAILY  45 tablet  11  . omeprazole (PRILOSEC) 20 MG capsule Take 1 capsule (20 mg total) by mouth daily.  30 capsule  11  . spironolactone (ALDACTONE) 25 MG tablet TAKE 1 TABLET BY MOUTH EVERY DAY  30 tablet  11  . valsartan (DIOVAN) 80 MG tablet Take 2 tablets (160 mg total) by mouth 2 (two) times daily.  120 tablet  11   No current facility-administered medications for this encounter.    BP 142/84  Pulse 68  Wt 205 lb 12 oz (93.328 kg)  SpO2 96% General: NAD Neck: JVP difficult but does not appear significantly elevated, no thyromegaly or thyroid nodule.  Lungs: Clear to auscultation bilaterally with normal respiratory effort. CV: Nondisplaced PMI.  Heart regular S1/S2, no S3/S4, no murmur.  No peripheral edema.  No carotid bruit.  Normal pedal pulses.  Abdomen: Soft, nontender, no hepatosplenomegaly, no distention.  Neurologic: Alert and oriented x 3.  Psych: Normal affect. Extremities: No clubbing or cyanosis.   Assessment/Plan: 1. Chronic systolic CHF: Nonischemic cardiomyopathy.  ?Viral myocarditis versus familial cardiomyopathy.  Prominent apical trabeculations on last echo raise concern for noncompaction also.  EF remains 25-30% by echo in 1/15.  He has developed a cough and some wheezing but is still very active.  It is hard for me to tell if this is due to fluid overload.  His weight is up 2 lbs.  He is still very active and has not had ICD placed because her was thought to be NYHA class I in the past.  He has a narrow QRS so would not be a CRT candidate.  CPX in 2/15 showed overall normal functional capacity but he did not do as well as in 2013.  - I will get a BNP.  If elevated, I may try him on a low dose of Lasix.  - Continue current Coreg, Bidil, valsartan, and spironolactone.  Check BMET today.   - I do think that he has developed some symptoms and is probably NYHA class II.  I will refer him to EP for ICD consideration.  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  This will be an issue for ICD placement.   2. Hyperlipidemia: Check lipids today.  3. Cough/wheeze: Hard for me to tell if this is fluid overload or something else.  He does have a history of GERD and is off PPI currently.  He also has a history of childhood asthma. I will try him on omeprazole 20 mg daily to see if this helps symptoms.  As above, getting BNP and will consider Lasix if it is elevated.   Followup 1 month   Marca AnconaDalton Mel Langan 09/08/2013

## 2013-09-08 NOTE — Patient Instructions (Signed)
Start Omeprazole 20 mg daily  Labs today  You have been referred to Dr Graciela Husbands on Thursday July 2nd at 9:45 am  Your physician recommends that you schedule a follow-up appointment in: 1 month

## 2013-09-09 ENCOUNTER — Other Ambulatory Visit: Payer: Self-pay | Admitting: Pulmonary Disease

## 2013-09-30 ENCOUNTER — Ambulatory Visit (INDEPENDENT_AMBULATORY_CARE_PROVIDER_SITE_OTHER): Payer: 59 | Admitting: Internal Medicine

## 2013-09-30 ENCOUNTER — Encounter: Payer: Self-pay | Admitting: Internal Medicine

## 2013-09-30 VITALS — BP 147/87 | HR 73 | Ht 67.0 in | Wt 205.0 lb

## 2013-09-30 DIAGNOSIS — I428 Other cardiomyopathies: Secondary | ICD-10-CM

## 2013-09-30 DIAGNOSIS — I5022 Chronic systolic (congestive) heart failure: Secondary | ICD-10-CM

## 2013-09-30 HISTORY — DX: Other cardiomyopathies: I42.8

## 2013-09-30 NOTE — Progress Notes (Signed)
ELECTROPHYSIOLOGY CONSULT NOTE  Patient ID: Edward Greene, MRN: [id number], DOB/AGE: 54-Aug-1962 54 y.o. Admit date: (Not on file) Date of Consult: 09/30/2013  Primary Physician: Default, Provider, MD Primary Cardiologist: DM Chief Complaint: ICD   HPI Edward Greene is a 53 y.o. male  Referred for consideration of ICD implantation.  He has a history of a nonischemic cardiomyopathy. He initially presented 9/12 at which time catheterization was negative. Ejection fraction was measured at 15%. CTX was surprisingly normal. VE/VCO2 slope was normal and a VO2 max 33.6. Ejection fraction 3/13 demonstrated persistent left ventricular dysfunction.  He was then lost to followup until 12/14. He was felt at that time to still be class I. Medications were continued.  He was seen in the [facility] 6/15. He had run out of medications for a couple of weeks.  Reevaluation of his myocardium noted prominent trabeculations with? Non-compaction.     Past Medical History  Diagnosis Date  . Chronic systolic heart failure     NICM  . Asthma     Childhood  . Hyperlipidemia   . Hypertension       Surgical History:  Past Surgical History  Procedure Laterality Date  . Appendectomy  at age 53     Home Meds: Prior to Admission medications   Medication Sig Start Date End Date Taking? Authorizing Provider  atorvastatin (LIPITOR) 20 MG tablet TAKE 1 TABLET BY MOUTH DAILY 03/29/13  Yes Laurey Morale, MD  carvedilol (COREG) 25 MG tablet TAKE 1 TABLET BY MOUTH TWICE DAILY WITH A MEAL 03/29/13  Yes Laurey Morale, MD  isosorbide-hydrALAZINE (BIDIL) 20-37.5 MG per tablet Take 1 tablet by mouth 2 (two) times daily. TAKE 1/2 TABLET BY MOUTH THREE TIMES DAILY 03/29/13  Yes Laurey Morale, MD  omeprazole (PRILOSEC) 20 MG capsule Take 1 capsule (20 mg total) by mouth daily. 09/08/13  Yes Laurey Morale, MD  spironolactone (ALDACTONE) 25 MG tablet TAKE 1 TABLET BY MOUTH EVERY DAY 03/29/13   Yes Laurey Morale, MD  valsartan (DIOVAN) 80 MG tablet Take 2 tablets (160 mg total) by mouth 2 (two) times daily. 03/29/13 03/29/14 Yes Laurey Morale, MD      Allergies: No Known Allergies  History   Social History  . Marital Status: Married    Spouse Name: N/A    Number of Children: Y  . Years of Education: N/A   Occupational History  . DIETARY ASSISTANT    Social History Main Topics  . Smoking status: Never Smoker   . Smokeless tobacco: Not on file  . Alcohol Use: No  . Drug Use: No  . Sexual Activity: Not on file   Other Topics Concern  . Not on file   Social History Narrative  . No narrative on file     Family History  Problem Relation Age of Onset  . Hypertension      siblings  . Cancer Mother     ?stomach?  . Heart failure Father     pacemaker     ROS:  Please see the history of present illness.     All other systems reviewed and negative.    Physical Exam:   Blood pressure 147/87, pulse 73, height 5\' 7"  (1.702 m), weight 205 lb (92.987 kg). General: Well developed, well nourished male in no acute distress. Head: Normocephalic, atraumatic, sclera non-icteric, no xanthomas, nares are without discharge. EENT: normal Lymph Nodes:  none Back: without scoliosis/kyphosis , no  CVA tendersness Neck: Negative for carotid bruits. JVD not elevated. Lungs: Clear bilaterally to auscultation without wheezes, rales, or rhonchi. Breathing is unlabored. Heart: RRR with diminished  S1 S2. No murmur , rubs, or gallops appreciated. Abdomen: Soft, non-tender, non-distended with normoactive bowel sounds. No hepatomegaly. No rebound/guarding. No obvious abdominal masses. Msk:  Strength and tone appear normal for age. Extremities: No clubbing or cyanosis. No  edema.  Distal pedal pulses are 2+ and equal bilaterally. Skin: Warm and Dry Neuro: Alert and oriented X 3. CN III-XII intact Grossly normal sensory and motor function . Psych:  Responds to questions appropriately  with a normal affect.      Labs: Cardiac Enzymes No results found for this basename: CKTOTAL, CKMB, TROPONINI,  in the last 72 hours CBC Lab Results  Component Value Date   WBC 4.8 03/29/2013   HGB 13.8 03/29/2013   HCT 43.1 03/29/2013   MCV 86.8 03/29/2013   PLT 166.0 03/29/2013   PROTIME: No results found for this basename: LABPROT, INR,  in the last 72 hours Chemistry No results found for this basename: NA, K, CL, CO2, BUN, CREATININE, CALCIUM, LABALBU, PROT, BILITOT, ALKPHOS, ALT, AST, GLUCOSE,  in the last 168 hours Lipids Lab Results  Component Value Date   CHOL 100 09/08/2013   HDL 20* 09/08/2013   LDLCALC 35 09/08/2013   TRIG 227* 09/08/2013   BNP Pro B Natriuretic peptide (BNP)  Date/Time Value Ref Range Status  09/08/2013  9:20 AM 18.5  0 - 125 pg/mL Final  01/16/2011 10:19 AM 81.0  0.0 - 100.0 pg/mL Final  12/12/2010  1:06 PM 83.0  0.0 - 100.0 pg/mL Final  12/02/2010 11:21 AM 218.7* 0 - 125 pg/mL Final   Miscellaneous Lab Results  Component Value Date   DDIMER 1.17* 11/30/2010    Radiology/Studies:  No results found.  EKG: Sinus rhythm at 73 Intervals 16/10/42 Repolarization abnormalities inferolaterally   Assessment and Plan: *   Nonischemic cardiomyopathy  Congestive heart failure-chronic-systolic-class II  He's had progressive symptoms of exercise intolerance over the last year. He is short of breath now climbing a flight of stairs.  We have discussed subcutaneous versus transvenous ICD. We have discussed the relative merits of lead stability, battery longevity, implications of infection, inappropriate therapies, and issues related to transvalvular lead location.  He would like to consider S. ICD implantation. Will have been screened today.  He also is aware that this would have an impact on his ability to box.  Sherryl MangesSteven Klein

## 2013-09-30 NOTE — Patient Instructions (Signed)
Your physician recommends that you continue on your current medications as directed. Please refer to the Current Medication list given to you today.  We will contact you about S-ICD once we have gotten insurance approval.

## 2013-10-13 ENCOUNTER — Ambulatory Visit (HOSPITAL_COMMUNITY)
Admission: RE | Admit: 2013-10-13 | Discharge: 2013-10-13 | Disposition: A | Discharge status: Home / Self Care | Payer: 59 | Source: Ambulatory Visit | Attending: Cardiology | Admitting: Cardiology

## 2013-10-13 VITALS — BP 92/52 | HR 73 | Wt 202.8 lb

## 2013-10-13 DIAGNOSIS — R059 Cough, unspecified: Secondary | ICD-10-CM

## 2013-10-13 DIAGNOSIS — I428 Other cardiomyopathies: Secondary | ICD-10-CM | POA: Insufficient documentation

## 2013-10-13 DIAGNOSIS — I5022 Chronic systolic (congestive) heart failure: Secondary | ICD-10-CM | POA: Diagnosis present

## 2013-10-13 DIAGNOSIS — R05 Cough: Secondary | ICD-10-CM | POA: Diagnosis not present

## 2013-10-13 DIAGNOSIS — N189 Chronic kidney disease, unspecified: Secondary | ICD-10-CM | POA: Diagnosis not present

## 2013-10-13 DIAGNOSIS — I509 Heart failure, unspecified: Secondary | ICD-10-CM | POA: Insufficient documentation

## 2013-10-13 DIAGNOSIS — R053 Chronic cough: Secondary | ICD-10-CM

## 2013-10-13 DIAGNOSIS — E785 Hyperlipidemia, unspecified: Secondary | ICD-10-CM | POA: Insufficient documentation

## 2013-10-13 NOTE — Patient Instructions (Addendum)
Stop Omeprazole  Lab in September (bmet)  We will contact you in 6 months to schedule your next appointment.

## 2013-10-13 NOTE — Progress Notes (Signed)
Patient ID: PARKE CONNEELY, male   DOB: 12/26/1960, 53 y.o.   MRN: [id number] 53 yo with minimal past history presented initially after hospitalization with acute systolic CHF.  In 9/12, patient began to notice dyspnea with heavy exertion, such as when he carried something up a flight of steps. He also was having difficulty sleeping with symptoms that sounded consistent with orthopnea.  No chest pain.  He went to urgent care for evaluation, and CXR showed pulmonary edema.  BNP was elevated and ECG was abnormal.  He was sent to the ER.  Evaluation showed CHF with volume overload.  Echo showed EF 15% with moderate to severe LV dilation and multiple wall motion abnormalities.  Cardiac enzymes were negative.  Patient was diuresed and started on cardiac meds.  Left heart cath in 9/12 showed no angiographic CAD.  Cardiac MRI (9/12) showed EF 15%, moderate to severe LV dilation, mild RV dysfunction, and no delayed enhancement.  Cardiopulmonary exercise test, however, showed no significant functional limitation with VO2 max 33.6 and normal VE/VCO2 slope.  Echo 3/13 showed EF 25-30%.  Last echo in 1/15 showed EF remained 25-30% with moderate LV dilation and global hypokinesis.  There were prominent apical trabeculations concerning for possilbe noncompaction, RV was normal.  CPX was repeated in 2/15.  Peak VO2 was good at 26 but lower than prior.  VE/VCO2 slope 27 with RER 1.26.   Symptomatically, Mr Mullikin is stable.  He still has a cough and occasional wheezing.  PPI use did not help this. He has spells where he feels like it is hard to breathe, but these spells do not tend to be associated with exertion.  He has mild dyspnea walking up a flight of steps.  He is still very active, working at a gym training boxers.  He does some sparring still and uses the bags.  He rides a bike with no problems and has no dyspnea walking.  He does not feel that he has the stamina that he used to have.  No lightheadedness or syncope.  After last appointment, I sent him to Dr Graciela Husbands for ICD consideration.  They talked about a subcutaneous ICD.    Labs (9/12): K 4.3, creatinine 1.7 => 1.6, pro-BNP 907=>218, SPEP negative, HIV negative, transferrin saturation 47%, LDL 145, HDL 33 Labs (10/12): K 3.5, creatinine 1.4, LDL 55, HDL 38, BNP 81 Labs (11/12): K 4, creatinine 1.4 Labs (3/15): K 3.9, creatinine 1.6 Labs (6/15): K 4.2, creatinine 1.45, LDL 38, HDL 20  PMH: 1. Childhood asthma 2. Hyperlipidemia 3. Systolic CHF: Echo (9/12) with EF 15%, moderate-severe LV dilation, inferior/posterior/inferoseptal/apical akinesis, septal/anterolateral severe hypokinesis, mild to moderate RV dilation with normal RV systolic function.  HIV negative, SPEP negative.  No history of ETOH or drugs.  No history of HTN.  LHC (9/12) with no angiographic CAD. Possible viral myocarditis versus familial cardiomyopathy.  Cardiac MRI (9/12) with EF 15%, moderate to severe LV dilation, mild RV dysfunction, no myocardial delayed enhancement.  Echo (1/13): EF 15%, diffuse HK, grade I diastolic dysfunction.  CPX (1/13): VO2 max 33.6, VE/VCO2 normal.  Echo (3/13): EF 25-30%, global hypokinesis, normal RV.  Echo (1/15): EF 25-30%, moderate LV dilation, global hypokinesis, prominent apical trabeculations, normal RV.  CPX (2/15): RER 1.26, peak VO2 26, VE/VCO2 27.  4. ACEI cough 5. CKD  FH: Father with CHF in his 81s, thinks he had an MI.  Father has PCM.  Father had 9 brothers, all had MIs or CHF.  Sister  with MI in her 3850s.   SH: Lives in [place] with his wife.  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  No tobacco, no ETOH, no no drugs.   ROS: All systems reviewed and negative except as per HPI.   Current Outpatient Prescriptions  Medication Sig Dispense Refill  . atorvastatin (LIPITOR) 20 MG tablet TAKE 1 TABLET BY MOUTH DAILY  30 tablet  11  . carvedilol (COREG) 25 MG tablet TAKE 1 TABLET BY MOUTH TWICE DAILY WITH A MEAL  60  tablet  11  . isosorbide-hydrALAZINE (BIDIL) 20-37.5 MG per tablet TAKE 1/2 TABLET BY MOUTH THREE TIMES DAILY      . spironolactone (ALDACTONE) 25 MG tablet TAKE 1 TABLET BY MOUTH EVERY DAY  30 tablet  11  . valsartan (DIOVAN) 80 MG tablet Take 2 tablets (160 mg total) by mouth 2 (two) times daily.  120 tablet  11   No current facility-administered medications for this encounter.    BP 92/52  Pulse 73  Wt 202 lb 12 oz (91.967 kg)  SpO2 97% General: NAD Neck: JVP difficult but does not appear significantly elevated, no thyromegaly or thyroid nodule.  Lungs: Clear to auscultation bilaterally with normal respiratory effort. CV: Nondisplaced PMI.  Heart regular S1/S2, no S3/S4, no murmur.  No peripheral edema.  No carotid bruit.  Normal pedal pulses.  Abdomen: Soft, nontender, no hepatosplenomegaly, no distention.  Neurologic: Alert and oriented x 3.  Psych: Normal affect. Extremities: No clubbing or cyanosis.   Assessment/Plan: 1. Chronic systolic CHF: Nonischemic cardiomyopathy.  ?Viral myocarditis versus familial cardiomyopathy.  Prominent apical trabeculations on last echo raise concern for noncompaction also.  EF remains 25-30% by echo in 1/15.  He is still very active and has not had ICD placed because he was thought to be NYHA class I in the past.  He has a narrow QRS so would not be a CRT candidate.  CPX in 2/15 showed overall normal functional capacity but he did not do as well as in 2013.  He also has noted some mild dyspnea with stairs.  I think that he is probably NYHA class II at this point.  - He is not volume overloaded and is not on Lasix. - Continue current Coreg, Bidil, valsartan, and spironolactone.  BMET in 9/15.    - I do think that he has developed some symptoms and is probably NYHA class II.  He has seen EP and is ready to undergo ICD placement (possible subcutaneous ICD).  He understands that he should not do any more sparring after he has an ICD.  2. Hyperlipidemia:  Good lipids on current statin.  3. Cough/wheeze: PPI did not help.  He is not particularly volume overloaded on exam.   Marca AnconaDalton Amilah Greenspan 10/13/2013

## 2013-11-04 ENCOUNTER — Telehealth: Payer: Self-pay | Admitting: *Deleted

## 2013-11-04 NOTE — Telephone Encounter (Signed)
Left message for patient to call back. Need to schedule ICD implant.  Informed pt that I would be out of the office until 8/18, and would call him back when I return. Advised to call office if he has questions and triage nurse will help.   (need to inform patient that he was denied for S-ICD implant and that we will have to resort to traditional ICD)

## 2013-11-08 ENCOUNTER — Telehealth: Payer: Self-pay | Admitting: Cardiology

## 2013-11-08 NOTE — Telephone Encounter (Signed)
New message     Returning a nurses call.  He said someone is trying to schedule an appt for him.

## 2013-11-08 NOTE — Telephone Encounter (Signed)
Pt was calling back to schedule ICD - Advised Roanna Raider will call him back when she returns to schedule the procedure.  He denies any concerns or complaints and stating understanding.

## 2013-11-19 NOTE — Telephone Encounter (Signed)
Left message that I would call patient back next week.

## 2013-11-26 NOTE — Telephone Encounter (Signed)
Informed patient that insurance denied SICD implantation and that we would have to go the traditional ICD route. Pt hesitant and not happy with this. Requesting to speak with Graciela Husbands further about traditional device - scheduled him for OV on 9/8 to discuss.  Also scheduled ICD procedure for 9/25. We agreed to discuss detail, go over instructions, schedule lab and wound check when he comes in on 9/8.

## 2013-11-26 NOTE — Telephone Encounter (Signed)
Follow up  Pt called to follow up on scheduling the report.. Please call back to discuss

## 2013-12-01 ENCOUNTER — Ambulatory Visit (HOSPITAL_COMMUNITY)
Admission: RE | Admit: 2013-12-01 | Discharge: 2013-12-01 | Disposition: A | Discharge status: Home / Self Care | Payer: 59 | Source: Ambulatory Visit | Attending: Cardiology | Admitting: Cardiology

## 2013-12-01 DIAGNOSIS — I509 Heart failure, unspecified: Secondary | ICD-10-CM | POA: Diagnosis not present

## 2013-12-01 DIAGNOSIS — I5022 Chronic systolic (congestive) heart failure: Secondary | ICD-10-CM | POA: Diagnosis present

## 2013-12-01 LAB — BASIC METABOLIC PANEL
Anion gap: 10 (ref 5–15)
BUN: 32 mg/dL — ABNORMAL HIGH (ref 6–23)
CALCIUM: 9.2 mg/dL (ref 8.4–10.5)
CO2: 22 meq/L (ref 19–32)
CREATININE: 2.01 mg/dL — AB (ref 0.50–1.35)
Chloride: 108 mEq/L (ref 96–112)
GFR calc Af Amer: 42 mL/min — ABNORMAL LOW (ref >90)
GFR, EST NON AFRICAN AMERICAN: 36 mL/min — AB (ref >90)
Glucose, Bld: 103 mg/dL — ABNORMAL HIGH (ref 70–99)
Potassium: 5.2 mEq/L (ref 3.7–5.3)
SODIUM: 140 meq/L (ref 137–147)

## 2013-12-07 ENCOUNTER — Ambulatory Visit (INDEPENDENT_AMBULATORY_CARE_PROVIDER_SITE_OTHER): Payer: 59 | Admitting: Internal Medicine

## 2013-12-07 ENCOUNTER — Encounter: Payer: Self-pay | Admitting: Internal Medicine

## 2013-12-07 VITALS — BP 92/70 | HR 80 | Ht 68.0 in | Wt 204.0 lb

## 2013-12-07 DIAGNOSIS — I428 Other cardiomyopathies: Secondary | ICD-10-CM

## 2013-12-07 DIAGNOSIS — I5022 Chronic systolic (congestive) heart failure: Secondary | ICD-10-CM

## 2013-12-07 NOTE — Progress Notes (Signed)
      Patient Care Team: Provider Default, MD as PCP - General James Allred, MD as Attending Physician (Cardiology)   HPI  Edward Greene is a 53 y.o. male Seen in repeat evaluation for consideration of ICD implantation for primary prevention with NICM and previously class 1 symptoms with VO2 Max 33.1. There had been interval deterioration of functional status.   Initially seen 7/15  The plan was SICD but was denied from insurance    Past Medical History  Diagnosis Date  . Chronic systolic heart failure     NICM  . Asthma     Childhood  . Hyperlipidemia   . Hypertension   . Nonischemic cardiomyopathy 09/30/2013    Past Surgical History  Procedure Laterality Date  . Appendectomy  at age 15    Current Outpatient Prescriptions  Medication Sig Dispense Refill  . atorvastatin (LIPITOR) 20 MG tablet TAKE 1 TABLET BY MOUTH DAILY  30 tablet  11  . carvedilol (COREG) 25 MG tablet TAKE 1 TABLET BY MOUTH TWICE DAILY WITH A MEAL  60 tablet  11  . isosorbide-hydrALAZINE (BIDIL) 20-37.5 MG per tablet TAKE 1/2 TABLET BY MOUTH THREE TIMES DAILY      . spironolactone (ALDACTONE) 25 MG tablet TAKE 1 TABLET BY MOUTH EVERY DAY  30 tablet  11  . valsartan (DIOVAN) 80 MG tablet Take 2 tablets (160 mg total) by mouth 2 (two) times daily.  120 tablet  11   No current facility-administered medications for this visit.    No Known Allergies  Review of Systems negative except from HPI and PMH  Physical Exam BP 92/70  Pulse 80  Ht 5' 8" (1.727 m)  Wt 204 lb (92.534 kg)  BMI 31.03 kg/m2 Well developed and well nourished in no acute distress HENT normal E scleral and icterus clear Neck Supple JVP flat; carotids brisk and full Clear to ausculation  egular rate and rhythm, no murmurs gallops or rub Soft with active bowel sounds No clubbing cyanosis  Edema Alert and oriented, grossly normal motor and sensory function Skin Warm and Dry    Assessment and  Plan  We spent about 30  minutes discussing ICD implantation with the relative merits of transvenous for subcutaneous. We discussed the downside including infection, inappropriate shocks, perforation or. He is willing to proceed.  

## 2013-12-07 NOTE — Patient Instructions (Signed)
Your physician recommends that you continue on your current medications as directed. Please refer to the Current Medication list given to you today.  You are scheduled for ICD implant. Shivank Pinedo, RN will contact you to review details.

## 2013-12-17 ENCOUNTER — Telehealth: Payer: Self-pay | Admitting: *Deleted

## 2013-12-17 ENCOUNTER — Encounter: Payer: Self-pay | Admitting: *Deleted

## 2013-12-17 DIAGNOSIS — Z01812 Encounter for preprocedural laboratory examination: Secondary | ICD-10-CM

## 2013-12-17 DIAGNOSIS — I5022 Chronic systolic (congestive) heart failure: Secondary | ICD-10-CM

## 2013-12-17 NOTE — Telephone Encounter (Signed)
Finally reached patient after several attempts and messages. Reviewed procedure instructions for 9/25, letter left at front desk for pt pick up. Pre procedure labs scheduled for 9/21.  Wound check on 10/5. Patient verbalized understanding and agreeable to plan.

## 2013-12-20 ENCOUNTER — Other Ambulatory Visit (INDEPENDENT_AMBULATORY_CARE_PROVIDER_SITE_OTHER): Payer: 59

## 2013-12-20 DIAGNOSIS — I5022 Chronic systolic (congestive) heart failure: Secondary | ICD-10-CM

## 2013-12-20 DIAGNOSIS — Z01812 Encounter for preprocedural laboratory examination: Secondary | ICD-10-CM

## 2013-12-20 LAB — CBC WITH DIFFERENTIAL/PLATELET
BASOS ABS: 0 10*3/uL (ref 0.0–0.1)
Basophils Relative: 0.1 % (ref 0.0–3.0)
EOS ABS: 0.1 10*3/uL (ref 0.0–0.7)
Eosinophils Relative: 0.7 % (ref 0.0–5.0)
HCT: 38.2 % — ABNORMAL LOW (ref 39.0–52.0)
HEMOGLOBIN: 12.2 g/dL — AB (ref 13.0–17.0)
LYMPHS PCT: 29.7 % (ref 12.0–46.0)
Lymphs Abs: 2.3 10*3/uL (ref 0.7–4.0)
MCHC: 31.9 g/dL (ref 30.0–36.0)
MCV: 88.2 fl (ref 78.0–100.0)
MONOS PCT: 7.7 % (ref 3.0–12.0)
Monocytes Absolute: 0.6 10*3/uL (ref 0.1–1.0)
NEUTROS PCT: 61.8 % (ref 43.0–77.0)
Neutro Abs: 4.8 10*3/uL (ref 1.4–7.7)
Platelets: 172 10*3/uL (ref 150.0–400.0)
RBC: 4.33 Mil/uL (ref 4.22–5.81)
RDW: 13.6 % (ref 11.5–15.5)
WBC: 7.7 10*3/uL (ref 4.0–10.5)

## 2013-12-20 LAB — BASIC METABOLIC PANEL
BUN: 25 mg/dL — ABNORMAL HIGH (ref 6–23)
CALCIUM: 9.5 mg/dL (ref 8.4–10.5)
CO2: 27 meq/L (ref 19–32)
CREATININE: 1.8 mg/dL — AB (ref 0.4–1.5)
Chloride: 110 mEq/L (ref 96–112)
GFR: 51.86 mL/min — AB (ref >60.00)
Glucose, Bld: 88 mg/dL (ref 70–99)
Potassium: 4.6 mEq/L (ref 3.5–5.1)
SODIUM: 143 meq/L (ref 135–145)

## 2013-12-23 DIAGNOSIS — Z79899 Other long term (current) drug therapy: Secondary | ICD-10-CM | POA: Diagnosis not present

## 2013-12-23 DIAGNOSIS — I5022 Chronic systolic (congestive) heart failure: Secondary | ICD-10-CM | POA: Diagnosis not present

## 2013-12-23 DIAGNOSIS — I428 Other cardiomyopathies: Secondary | ICD-10-CM | POA: Diagnosis present

## 2013-12-23 DIAGNOSIS — E785 Hyperlipidemia, unspecified: Secondary | ICD-10-CM | POA: Diagnosis not present

## 2013-12-23 DIAGNOSIS — I509 Heart failure, unspecified: Secondary | ICD-10-CM | POA: Diagnosis not present

## 2013-12-23 DIAGNOSIS — I10 Essential (primary) hypertension: Secondary | ICD-10-CM | POA: Diagnosis not present

## 2013-12-23 MED ORDER — SODIUM CHLORIDE 0.9 % IV SOLN
INTRAVENOUS | Status: DC
  Administered 2013-12-24: 10:00:00 via INTRAVENOUS

## 2013-12-23 MED ORDER — SODIUM CHLORIDE 0.9 % IR SOLN
80.0000 mg | Status: DC
  Filled 2013-12-23: qty 2

## 2013-12-23 MED ORDER — MUPIROCIN 2 % EX OINT
1.0000 "application " | TOPICAL_OINTMENT | Freq: Once | CUTANEOUS | Status: DC
  Filled 2013-12-23: qty 22

## 2013-12-23 MED ORDER — CEFAZOLIN SODIUM-DEXTROSE 2-3 GM-% IV SOLR: 2.0000 g | INTRAVENOUS | Status: DC

## 2013-12-24 ENCOUNTER — Encounter (HOSPITAL_COMMUNITY): Payer: Self-pay | Admitting: General Practice

## 2013-12-24 ENCOUNTER — Encounter (HOSPITAL_COMMUNITY)
Admission: RE | Disposition: A | Discharge status: Home / Self Care | Payer: 59 | Source: Ambulatory Visit | Attending: Internal Medicine

## 2013-12-24 ENCOUNTER — Ambulatory Visit (HOSPITAL_COMMUNITY)
Admission: RE | Admit: 2013-12-24 | Discharge: 2013-12-25 | Disposition: A | Discharge status: Home / Self Care | Payer: 59 | Source: Ambulatory Visit | Attending: Internal Medicine | Admitting: Internal Medicine

## 2013-12-24 DIAGNOSIS — I5022 Chronic systolic (congestive) heart failure: Secondary | ICD-10-CM | POA: Diagnosis not present

## 2013-12-24 DIAGNOSIS — Z79899 Other long term (current) drug therapy: Secondary | ICD-10-CM | POA: Insufficient documentation

## 2013-12-24 DIAGNOSIS — I509 Heart failure, unspecified: Secondary | ICD-10-CM | POA: Insufficient documentation

## 2013-12-24 DIAGNOSIS — I10 Essential (primary) hypertension: Secondary | ICD-10-CM | POA: Insufficient documentation

## 2013-12-24 DIAGNOSIS — I428 Other cardiomyopathies: Secondary | ICD-10-CM

## 2013-12-24 DIAGNOSIS — E785 Hyperlipidemia, unspecified: Secondary | ICD-10-CM | POA: Insufficient documentation

## 2013-12-24 HISTORY — DX: Unspecified asthma, uncomplicated: J45.909

## 2013-12-24 HISTORY — PX: CARDIAC DEFIBRILLATOR PLACEMENT: SHX171

## 2013-12-24 HISTORY — PX: IMPLANTABLE CARDIOVERTER DEFIBRILLATOR IMPLANT: SHX5473

## 2013-12-24 LAB — SURGICAL PCR SCREEN
MRSA, PCR: NEGATIVE
STAPHYLOCOCCUS AUREUS: POSITIVE — AB

## 2013-12-24 SURGERY — IMPLANTABLE CARDIOVERTER DEFIBRILLATOR IMPLANT
Anesthesia: LOCAL

## 2013-12-24 MED ORDER — MIDAZOLAM HCL 5 MG/5ML IJ SOLN
INTRAMUSCULAR | Status: AC
  Filled 2013-12-24: qty 5

## 2013-12-24 MED ORDER — ISOSORB DINITRATE-HYDRALAZINE 20-37.5 MG PO TABS
0.5000 | ORAL_TABLET | Freq: Three times a day (TID) | ORAL | Status: DC
  Administered 2013-12-24: 0.5 via ORAL
  Filled 2013-12-24 (×12): qty 0.5

## 2013-12-24 MED ORDER — ATORVASTATIN CALCIUM 20 MG PO TABS
20.0000 mg | ORAL_TABLET | Freq: Every day | ORAL | Status: DC
  Administered 2013-12-24: 21:00:00 20 mg via ORAL
  Filled 2013-12-24 (×5): qty 1

## 2013-12-24 MED ORDER — IRBESARTAN 75 MG PO TABS
75.0000 mg | ORAL_TABLET | Freq: Every day | ORAL | Status: DC
  Administered 2013-12-24: 14:00:00 75 mg via ORAL
  Filled 2013-12-24 (×5): qty 1

## 2013-12-24 MED ORDER — MUPIROCIN 2 % EX OINT
1.0000 "application " | TOPICAL_OINTMENT | Freq: Two times a day (BID) | CUTANEOUS | Status: DC
  Administered 2013-12-24 – 2013-12-25 (×2): 1 via NASAL
  Filled 2013-12-24: qty 22

## 2013-12-24 MED ORDER — SPIRONOLACTONE 25 MG PO TABS
25.0000 mg | ORAL_TABLET | Freq: Every day | ORAL | Status: DC
  Administered 2013-12-24: 14:00:00 25 mg via ORAL
  Filled 2013-12-24 (×5): qty 1

## 2013-12-24 MED ORDER — LIDOCAINE HCL (PF) 1 % IJ SOLN
INTRAMUSCULAR | Status: AC
  Filled 2013-12-24: qty 60

## 2013-12-24 MED ORDER — ACETAMINOPHEN 325 MG PO TABS
325.0000 mg | ORAL_TABLET | ORAL | Status: DC | PRN
  Administered 2013-12-24: 14:00:00 650 mg via ORAL
  Filled 2013-12-24: qty 2

## 2013-12-24 MED ORDER — CHLORHEXIDINE GLUCONATE CLOTH 2 % EX PADS
6.0000 | MEDICATED_PAD | Freq: Every day | CUTANEOUS | Status: DC
  Administered 2013-12-25: 6 via TOPICAL

## 2013-12-24 MED ORDER — CARVEDILOL 25 MG PO TABS
25.0000 mg | ORAL_TABLET | Freq: Two times a day (BID) | ORAL | Status: DC
  Administered 2013-12-24: 25 mg via ORAL
  Filled 2013-12-24 (×9): qty 1

## 2013-12-24 MED ORDER — HEPARIN (PORCINE) IN NACL 2-0.9 UNIT/ML-% IJ SOLN
INTRAMUSCULAR | Status: AC
  Filled 2013-12-24: qty 500

## 2013-12-24 MED ORDER — YOU HAVE A PACEMAKER BOOK
Freq: Once | Status: AC
  Administered 2013-12-25: 05:00:00
  Filled 2013-12-24: qty 1

## 2013-12-24 MED ORDER — MUPIROCIN 2 % EX OINT
TOPICAL_OINTMENT | CUTANEOUS | Status: AC
  Filled 2013-12-24: qty 22

## 2013-12-24 MED ORDER — CEFAZOLIN SODIUM 1-5 GM-% IV SOLN
1.0000 g | Freq: Four times a day (QID) | INTRAVENOUS | Status: AC
  Administered 2013-12-24 – 2013-12-25 (×3): 1 g via INTRAVENOUS
  Filled 2013-12-24 (×3): qty 50

## 2013-12-24 MED ORDER — TRAMADOL HCL 50 MG PO TABS
50.0000 mg | ORAL_TABLET | Freq: Four times a day (QID) | ORAL | Status: DC | PRN
  Administered 2013-12-24: 15:00:00 50 mg via ORAL
  Filled 2013-12-24: qty 1

## 2013-12-24 MED ORDER — ONDANSETRON HCL 4 MG/2ML IJ SOLN: 4.0000 mg | Freq: Four times a day (QID) | INTRAMUSCULAR | Status: DC | PRN

## 2013-12-24 MED ORDER — SODIUM CHLORIDE 0.9 % IV SOLN: INTRAVENOUS | Status: AC

## 2013-12-24 MED ORDER — FENTANYL CITRATE 0.05 MG/ML IJ SOLN
INTRAMUSCULAR | Status: AC
  Filled 2013-12-24: qty 2

## 2013-12-24 NOTE — Progress Notes (Signed)
You have a Pacemaker and/or ICD book given to patient. 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 Questions answered. Pt voiced understanding.

## 2013-12-24 NOTE — CV Procedure (Signed)
Edward Greene [id number]  [id number]  Preop XI:[id number] cardiomyopathy  Class 2 Postop Dx same/   Procedure: single chamber ICD implantation with out  intraoperative defibrillation threshold testing  Following the obtaining of informed consent the patient was brought to the electrophysiology laboratory in place of the fluoroscopic table in the supine position. After routine prep and drape, lidocaine was infiltrated in the prepectoral subclavicular region and an incision was made and carried down to the layer of the prepectoral fascia using electrocautery and sharp dissection. A pocket was formed similarly.  Thereafter an attention was turned to gain access to the extrathoracic left subclavian vein which was accomplished without  difficulty and without the aspiration of air or puncture of the artery. A 9.5  French sheath was placed for which was then passed a [employer]   single coil  active fixation defibrillator lead, model [id number] serial number [id number]. It was  passed under fluoroscopic guidance to the right ventricular apex. In its location the bipolar R wave was 7.6 millivolts, impedance was 660 ohms, the pacing threshold was 0.5 volts at 0.5 msec. Current at threshold was 1 mA.  There was no diaphragmatic pacing at 10 V. The current of injury was minimal.  The lead was secured to the prepectoral fascia and then attached to a [employer]  ICD, serial number  [id number].  Through the device, the bipolar R wave was 7.4 millivolts, impedance was 650 ohms, the pacing threshold was 0.5 volts at 0.5 msec. High-voltage impedance was 71 ohms.  The pocket was copiously irrigated with antibiotic containing saline solution. Hemostasis was assured, and the device and the lead was placed in the pocket and secured to the prepectoral fascia.  The wound was closed in 2  layers in normal fashion. The wound was washed dried and a DERMABOND  was then applied. Needle counts sponge counts and instrument counts were  correct at the end of the procedure according to the staff.    Patient tolerated the procedure without apparent complication  EBL  Minimal      Cx: None      Sherryl Manges, MD 12/24/2013 12:15 PM

## 2013-12-24 NOTE — H&P (View-Only) (Signed)
      Patient Care Team: Provider Default, MD as PCP - General Hillis Range, MD as Attending Physician (Cardiology)   HPI  Edward Greene is a 53 y.o. male Seen in repeat evaluation for consideration of ICD implantation for primary prevention with NICM and previously class 1 symptoms with VO2 Max 33.1. There had been interval deterioration of functional status.   Initially seen 7/15  The plan was SICD but was denied from insurance    Past Medical History  Diagnosis Date  . Chronic systolic heart failure     NICM  . Asthma     Childhood  . Hyperlipidemia   . Hypertension   . Nonischemic cardiomyopathy 09/30/2013    Past Surgical History  Procedure Laterality Date  . Appendectomy  at age 3    Current Outpatient Prescriptions  Medication Sig Dispense Refill  . atorvastatin (LIPITOR) 20 MG tablet TAKE 1 TABLET BY MOUTH DAILY  30 tablet  11  . carvedilol (COREG) 25 MG tablet TAKE 1 TABLET BY MOUTH TWICE DAILY WITH A MEAL  60 tablet  11  . isosorbide-hydrALAZINE (BIDIL) 20-37.5 MG per tablet TAKE 1/2 TABLET BY MOUTH THREE TIMES DAILY      . spironolactone (ALDACTONE) 25 MG tablet TAKE 1 TABLET BY MOUTH EVERY DAY  30 tablet  11  . valsartan (DIOVAN) 80 MG tablet Take 2 tablets (160 mg total) by mouth 2 (two) times daily.  120 tablet  11   No current facility-administered medications for this visit.    No Known Allergies  Review of Systems negative except from HPI and PMH  Physical Exam BP 92/70  Pulse 80  Ht 5\' 8"  (1.727 m)  Wt 204 lb (92.534 kg)  BMI 31.03 kg/m2 Well developed and well nourished in no acute distress HENT normal E scleral and icterus clear Neck Supple JVP flat; carotids brisk and full Clear to ausculation  egular rate and rhythm, no murmurs gallops or rub Soft with active bowel sounds No clubbing cyanosis  Edema Alert and oriented, grossly normal motor and sensory function Skin Warm and Dry    Assessment and  Plan  We spent about 30  minutes discussing ICD implantation with the relative merits of transvenous for subcutaneous. We discussed the downside including infection, inappropriate shocks, perforation or. He is willing to proceed.

## 2013-12-24 NOTE — Interval H&P Note (Signed)
ICD Criteria  Current LVEF:25% ;Obtained > 6 months ago.   NYHA Functional Classification: Class II  Heart Failure History:  Yes, Duration of heart failure since onset is > 9 months  Non-Ischemic Dilated Cardiomyopathy History:  Yes, timeframe is > 9 months  Atrial Fibrillation/Atrial Flutter:  No.  Ventricular Tachycardia History:  No.  Cardiac Arrest History:  No  History of Syndromes with Risk of Sudden Death:  No.  Previous ICD:  No.  Electrophysiology Study: No.  Prior MI: No.  PPM: No.  OSA:  No  Patient Life Expectancy of >=1 year: Yes.  Anticoagulation Therapy:  Patient is NOT on anticoagulation therapy.   Beta Blocker Therapy:  Yes.   Ace Inhibitor/ARB Therapy:  Yes.History and Physical Interval Note:  12/24/2013 11:21 AM  Edward Greene  has presented today for surgery, with the diagnosis of hf  The various methods of treatment have been discussed with the patient and family. After consideration of risks, benefits and other options for treatment, the patient has consented to  Procedure(s): IMPLANTABLE CARDIOVERTER DEFIBRILLATOR IMPLANT (N/A) as a surgical intervention .  The patient's history has been reviewed, patient examined, no change in status, stable for surgery.  I have reviewed the patient's chart and labs.  Questions were answered to the patient's satisfaction.     Sherryl Manges

## 2013-12-25 ENCOUNTER — Ambulatory Visit (HOSPITAL_COMMUNITY): Payer: 59

## 2013-12-25 DIAGNOSIS — E785 Hyperlipidemia, unspecified: Secondary | ICD-10-CM | POA: Diagnosis not present

## 2013-12-25 DIAGNOSIS — I10 Essential (primary) hypertension: Secondary | ICD-10-CM | POA: Diagnosis not present

## 2013-12-25 DIAGNOSIS — I428 Other cardiomyopathies: Secondary | ICD-10-CM | POA: Diagnosis not present

## 2013-12-25 DIAGNOSIS — I5022 Chronic systolic (congestive) heart failure: Secondary | ICD-10-CM | POA: Diagnosis not present

## 2013-12-25 MED ORDER — TRAMADOL HCL 50 MG PO TABS: 50.0000 mg | ORAL_TABLET | Freq: Two times a day (BID) | ORAL | Status: DC | PRN

## 2013-12-25 MED ORDER — ACETAMINOPHEN 325 MG PO TABS: 325.0000 mg | ORAL_TABLET | ORAL | Status: DC | PRN

## 2013-12-25 MED ORDER — SODIUM CHLORIDE 0.9 % IJ SOLN
3.0000 mL | Freq: Two times a day (BID) | INTRAMUSCULAR | Status: DC
  Administered 2013-12-25: 3 mL via INTRAVENOUS

## 2013-12-25 NOTE — Discharge Summary (Signed)
Discharge Summary   Patient ID: Edward Greene, MRN: [id number], DOB/AGE: Jan 13, 1961 53 y.o.  Admit date: 12/24/2013 Discharge date: 12/25/2013   Primary Care Physician:  No PCP Per Patient   Primary Cardiologist:  Dr. Marca Ancona  Primary Electrophysiologist:  Dr. Sherryl Manges   Reason for Admission:  AICD implantation   Primary Discharge Diagnoses:  Active Problems:   Nonischemic cardiomyopathy     Wt Readings from Last 3 Encounters:  12/25/13 205 lb 0.4 oz (93 kg)  12/25/13 205 lb 0.4 oz (93 kg)  12/07/13 204 lb (92.534 kg)    Secondary Discharge Diagnoses:   Past Medical History  Diagnosis Date  . Chronic systolic heart failure     NICM  . Hyperlipidemia   . Hypertension   . Nonischemic cardiomyopathy 09/30/2013  . Automatic implantable cardioverter-defibrillator in situ   . Childhood asthma       Allergies:   No Known Allergies    Procedures Performed This Admission:    1.  Single Chamber ICD Implantation with Dr. Sherryl Manges 12/24/2013   Hospital Course:  Edward Greene is a 53 y.o. male with a hx of systolic CHF 2/2 non-ischemic cardiomyopathy.  He is followed by Dr. Marca Ancona in the CHF clinic.  EF has remained 25-30%.  He was referred to Dr. Sherryl Manges for consideration of ICD implantation.  AICD implantation was recommended for primary prevention.  Risks and benefits were discussed with the patient and he agreed to proceed. He underwent placement of a single chamber ICD (St Jude ICD, serial number [id number]) by Dr. Graciela Husbands on 12/24/13.  Patient tolerated the procedure well and had no immediate complications.  CXR this AM demonstrates L sided ICD without pneumothorax or acute findings.   He had some post op hypotension but otherwise remained stable.  He was evaluated by Dr. Sherryl Manges this AM and felt to be stable for DC to home.  He has been given post procedure instructions and precautions for his L arm.  He will have wound check an 10 days, 6 week  FU with Dr. Marca Ancona and 3 mos FU with Dr. Sherryl Manges.      Discharge Vitals:   Blood pressure 115/68, pulse 87, temperature 98.3 F (36.8 C), temperature source Oral, resp. rate 18, height 5' 7.5" (1.715 m), weight 205 lb 0.4 oz (93 kg), SpO2 95.00%.   Labs:  None    Diagnostic Procedures and Studies:   - Dg Chest 2 View  12/25/2013   IMPRESSION: No active cardiopulmonary disease.  Left-sided pacemaker.   Electronically Signed   By: Elberta Fortis M.D.   On: 12/25/2013 09:38     Disposition:   Pt is being discharged home today in good condition.  Follow-up Plans & Appointments      Follow-up Information   Follow up with [facility] On 01/03/2014. (11:30 AM for wound check)    Contact information:   [address] [place] [address]       Follow up with Sherryl Manges, MD In 3 months. (office will arrange)    Specialty:  Cardiology   Contact information:   [address] [place] [address] [phone]       Follow up with Marca Ancona, MD In 6 weeks. (office will call to arrange)    Specialty:  Cardiology   Contact information:   [address] [place].  [address] [place] [address] [phone]  Discharge Medications    Medication List         acetaminophen 325 MG tablet  Commonly known as:  TYLENOL  Take 1-2 tablets (325-650 mg total) by mouth every 4 (four) hours as needed for mild pain.     atorvastatin 20 MG tablet  Commonly known as:  LIPITOR  Take 20 mg by mouth daily.     carvedilol 25 MG tablet  Commonly known as:  COREG  Take 25 mg by mouth 2 (two) times daily with a meal.     isosorbide-hydrALAZINE 20-37.5 MG per tablet  Commonly known as:  BIDIL  Take 0.5 tablets by mouth 3 (three) times daily.     spironolactone 25 MG tablet  Commonly known as:  ALDACTONE  Take 25 mg by mouth daily.     traMADol 50 MG tablet  Commonly known as:  ULTRAM  Take 1 tablet (50 mg total) by mouth every  12 (twelve) hours as needed for moderate pain or severe pain.     valsartan 80 MG tablet  Commonly known as:  DIOVAN  Take 2 tablets (160 mg total) by mouth 2 (two) times daily.         Outstanding Labs/Studies  1. None   Duration of Discharge Encounter: Greater than 30 minutes including physician and PA time.  Signed, Tereso Newcomer, PA-C   12/25/2013 10:48 AM

## 2013-12-25 NOTE — Discharge Summary (Signed)
instuctions given

## 2013-12-25 NOTE — Discharge Instructions (Signed)
° °  Supplemental Discharge Instructions for  Pacemaker/Defibrillator Patients  Activity No heavy lifting or vigorous activity with your left  arm for 6 to 8 weeks.  Do not raise your left  arm above your head for one week.  Gradually raise your affected arm as drawn below.                 NO DRIVING for  2    ; you may begin driving on  32/95/1884  . WOUND CARE   Keep the wound area clean and dry.  Do not get this area wet for one week. No showers for one week; you may shower on    12/31/2013   .   The tape/steri-strips on your wound will fall off; do not pull them off.  No bandage is needed on the site.  DO  NOT apply any creams, oils, or ointments to the wound area.   If you notice any drainage or discharge from the wound, any swelling or bruising at the site, or you develop a fever > 101? F after you are discharged home, call the office at once.  Special Instructions   You are still able to use cellular telephones; use the ear opposite the side where you have your pacemaker/defibrillator.  Avoid carrying your cellular phone near your device.   When traveling through airports, show security personnel your identification card to avoid being screened in the metal detectors.  Ask the security personnel to use the hand wand.   Avoid arc welding equipment, MRI testing (magnetic resonance imaging), TENS units (transcutaneous nerve stimulators).  Call the office for questions about other devices.   Avoid electrical appliances that are in poor condition or are not properly grounded.   Microwave ovens are safe to be near or to operate.  Additional information for defibrillator patients should your device go off:   If your device goes off ONCE and you feel fine afterward, notify the device clinic nurses.   If your device goes off ONCE and you do not feel well afterward, call 911.   If your device goes off TWICE, call 911.   If your device goes off THREE times in one day, call 911.  DO NOT DRIVE  YOURSELF OR A FAMILY MEMBER WITH A DEFIBRILLATOR TO THE HOSPITAL--CALL 911.

## 2013-12-25 NOTE — Progress Notes (Signed)
BP 101/65, dropped to 87/50 earlier today after taking meds post procedure.  States did not take any of his AM meds today.  Received coreg this evening but 2nd dose of Bidil held tonight. Will continue to monitor and give AM dose early if needed.  Tele SR 70.

## 2014-01-01 ENCOUNTER — Encounter (HOSPITAL_COMMUNITY): Payer: Self-pay | Admitting: *Deleted

## 2014-01-03 ENCOUNTER — Ambulatory Visit (INDEPENDENT_AMBULATORY_CARE_PROVIDER_SITE_OTHER): Payer: 59 | Admitting: *Deleted

## 2014-01-03 DIAGNOSIS — I428 Other cardiomyopathies: Secondary | ICD-10-CM

## 2014-01-03 DIAGNOSIS — I5022 Chronic systolic (congestive) heart failure: Secondary | ICD-10-CM

## 2014-01-03 DIAGNOSIS — I429 Cardiomyopathy, unspecified: Secondary | ICD-10-CM

## 2014-01-03 LAB — MDC_IDC_ENUM_SESS_TYPE_INCLINIC
Brady Statistic RV Percent Paced: 0 %
Date Time Interrogation Session: 20151005123812
HIGH POWER IMPEDANCE MEASURED VALUE: 60.75 Ohm
Implantable Pulse Generator Serial Number: 7228287
Lead Channel Impedance Value: 550 Ohm
Lead Channel Pacing Threshold Amplitude: 0.75 V
Lead Channel Pacing Threshold Pulse Width: 0.5 ms
Lead Channel Pacing Threshold Pulse Width: 0.5 ms
Lead Channel Sensing Intrinsic Amplitude: 11.9 mV
MDC IDC MSMT BATTERY REMAINING LONGEVITY: 102 mo
MDC IDC MSMT LEADCHNL RV PACING THRESHOLD AMPLITUDE: 0.75 V
MDC IDC SET LEADCHNL RV PACING AMPLITUDE: 3.5 V
MDC IDC SET LEADCHNL RV PACING PULSEWIDTH: 0.5 ms
MDC IDC SET LEADCHNL RV SENSING SENSITIVITY: 0.5 mV
MDC IDC SET ZONE DETECTION INTERVAL: 250 ms
Zone Setting Detection Interval: 300 ms

## 2014-01-03 NOTE — Progress Notes (Signed)
Wound check appointment. No steri-strips, dermabond present. Wound without redness or edema. Incision edges approximated, wound well healed. Normal device function. Threshold, sensing, and impedances consistent with implant measurements. Device programmed at 3.5V for extra safety margin until 3 month visit. Histogram distribution appropriate for patient and level of activity. No ventricular arrhythmias noted. Changed VF NID from 20 to 24. Patient educated about wound care, arm mobility, lifting restrictions, shock plan. ROV w/ Dr. Graciela Husbands in 80mo.

## 2014-01-12 ENCOUNTER — Encounter: Payer: Self-pay | Admitting: Internal Medicine

## 2014-03-10 ENCOUNTER — Encounter (HOSPITAL_COMMUNITY): Payer: Self-pay | Admitting: Internal Medicine

## 2014-03-31 ENCOUNTER — Other Ambulatory Visit: Payer: Self-pay | Admitting: Cardiology

## 2014-04-07 ENCOUNTER — Ambulatory Visit (INDEPENDENT_AMBULATORY_CARE_PROVIDER_SITE_OTHER): Payer: 59 | Admitting: Internal Medicine

## 2014-04-07 ENCOUNTER — Encounter: Payer: Self-pay | Admitting: Internal Medicine

## 2014-04-07 VITALS — BP 118/82 | HR 77 | Ht 68.0 in | Wt 200.0 lb

## 2014-04-07 DIAGNOSIS — I428 Other cardiomyopathies: Secondary | ICD-10-CM

## 2014-04-07 DIAGNOSIS — I5022 Chronic systolic (congestive) heart failure: Secondary | ICD-10-CM

## 2014-04-07 DIAGNOSIS — I429 Cardiomyopathy, unspecified: Secondary | ICD-10-CM

## 2014-04-07 DIAGNOSIS — Z4502 Encounter for adjustment and management of automatic implantable cardiac defibrillator: Secondary | ICD-10-CM

## 2014-04-07 LAB — MDC_IDC_ENUM_SESS_TYPE_INCLINIC
Battery Remaining Longevity: 99.6 mo
Brady Statistic RV Percent Paced: 0 %
Date Time Interrogation Session: 20160107150551
HighPow Impedance: 63 Ohm
Lead Channel Pacing Threshold Pulse Width: 0.5 ms
Lead Channel Sensing Intrinsic Amplitude: 8.2 mV
MDC IDC MSMT LEADCHNL RV IMPEDANCE VALUE: 462.5 Ohm
MDC IDC MSMT LEADCHNL RV PACING THRESHOLD AMPLITUDE: 1 V
MDC IDC PG SERIAL: 7228287
MDC IDC SET LEADCHNL RV PACING AMPLITUDE: 2.5 V
MDC IDC SET LEADCHNL RV PACING PULSEWIDTH: 0.5 ms
MDC IDC SET LEADCHNL RV SENSING SENSITIVITY: 0.5 mV
Zone Setting Detection Interval: 250 ms
Zone Setting Detection Interval: 300 ms

## 2014-04-07 NOTE — Progress Notes (Signed)
      Patient Care Team: No Pcp Per Patient as PCP - General (General Practice) Hillis Range, MD as Attending Physician (Cardiology)   HPI  Edward Greene is a 54 y.o. male Seen in follow-up for ICD implantation for primary prevention with NICM and previously class 1 symptoms with VO2 Max 33.1. There had been interval deterioration of functional status.  The plan was SICD but was denied from insurance. He underwent implantation 9/15    Past Medical History  Diagnosis Date  . Chronic systolic heart failure     NICM  . Hyperlipidemia   . Hypertension   . Nonischemic cardiomyopathy 09/30/2013  . Childhood asthma     Past Surgical History  Procedure Laterality Date  . Cardiac defibrillator placement  12/24/2013    STJ single chamber ICD implanted by Dr Graciela Husbands  . Appendectomy  1978  . Implantable cardioverter defibrillator implant N/A 12/24/2013    Procedure: IMPLANTABLE CARDIOVERTER DEFIBRILLATOR IMPLANT;  Surgeon: Duke Salvia, MD;  Location: [facility] CATH LAB;  Service: Cardiovascular;  Laterality: N/A;    Current Outpatient Prescriptions  Medication Sig Dispense Refill  . acetaminophen (TYLENOL) 325 MG tablet Take 1-2 tablets (325-650 mg total) by mouth every 4 (four) hours as needed for mild pain.    Marland Kitchen atorvastatin (LIPITOR) 20 MG tablet Take 20 mg by mouth daily.    Marland Kitchen BIDIL 20-37.5 MG per tablet TAKE 1/2 TABLET BY MOUTH THREE TIMES DAILY 45 tablet 0  . carvedilol (COREG) 25 MG tablet Take 25 mg by mouth 2 (two) times daily with a meal.    . isosorbide-hydrALAZINE (BIDIL) 20-37.5 MG per tablet Take 0.5 tablets by mouth 3 (three) times daily.     Marland Kitchen spironolactone (ALDACTONE) 25 MG tablet Take 25 mg by mouth daily.    . traMADol (ULTRAM) 50 MG tablet Take 1 tablet (50 mg total) by mouth every 12 (twelve) hours as needed for moderate pain or severe pain. 10 tablet 0  . valsartan (DIOVAN) 80 MG tablet Take 2 tablets (160 mg total) by mouth 2 (two) times daily. 120 tablet 11   No  current facility-administered medications for this visit.    No Known Allergies  Review of Systems negative except from HPI and PMH  Physical Exam BP 118/82 mmHg  Pulse 77  Ht 5\' 8"  (1.727 m)  Wt 200 lb (90.719 kg)  BMI 30.42 kg/m2 Well developed and well nourished in no acute distress HENT normal E scleral and icterus clear Neck Supple JVP flat; carotids brisk and full Clear to ausculation  egular rate and rhythm, no murmurs gallops or rub Soft with active bowel sounds No clubbing cyanosis  Edema Alert and oriented, grossly normal motor and sensory function Skin Warm and Dry  ECG  NSR  19/10/40  Assessment and  Plan  Nonischemic cardiomyopathy   congestive heart failure-chronic-systolic Euvolemic continue current meds   Implantable defibrillator-St. Jude The patient's device was interrogated and the information was fully reviewed.  The device was reprogrammed to  Decrease outputs  Doing well. Functionally stable. I've encouraged him to go back to the gym and worked out. We have talked about the difference between isometric and ISokinetic weight lifting  .

## 2014-04-07 NOTE — Patient Instructions (Addendum)
Your physician recommends that you continue on your current medications as directed. Please refer to the Current Medication list given to you today.  Remote monitoring is used to monitor your Pacemaker of ICD from home. This monitoring reduces the number of office visits required to check your device to one time per year. It allows us to keep an eye on the functioning of your device to ensure it is working properly. You are scheduled for a device check from home on 07/07/14. You may send your transmission at any time that day. If you have a wireless device, the transmission will be sent automatically. After your physician reviews your transmission, you will receive a postcard with your next transmission date.  Your physician wants you to follow-up in: 9 months with Dr. Klein.  You will receive a reminder letter in the mail two months in advance. If you don't receive a letter, please call our office to schedule the follow-up appointment.  

## 2014-04-16 ENCOUNTER — Other Ambulatory Visit: Payer: Self-pay | Admitting: Cardiology

## 2014-04-18 ENCOUNTER — Encounter (HOSPITAL_COMMUNITY): Payer: Self-pay | Admitting: Cardiology

## 2014-04-25 ENCOUNTER — Other Ambulatory Visit: Payer: Self-pay | Admitting: Cardiology

## 2014-05-16 ENCOUNTER — Other Ambulatory Visit (HOSPITAL_COMMUNITY): Payer: Self-pay | Admitting: Cardiology

## 2014-05-16 MED ORDER — ATORVASTATIN CALCIUM 20 MG PO TABS: 20.0000 mg | ORAL_TABLET | Freq: Every day | ORAL | Status: DC

## 2014-05-16 MED ORDER — CARVEDILOL 25 MG PO TABS: 25.0000 mg | ORAL_TABLET | Freq: Two times a day (BID) | ORAL | Status: DC

## 2014-05-16 MED ORDER — SPIRONOLACTONE 25 MG PO TABS: 25.0000 mg | ORAL_TABLET | Freq: Every day | ORAL | Status: DC

## 2014-05-16 MED ORDER — VALSARTAN 80 MG PO TABS: 160.0000 mg | ORAL_TABLET | Freq: Two times a day (BID) | ORAL | Status: DC

## 2014-06-13 ENCOUNTER — Other Ambulatory Visit (HOSPITAL_COMMUNITY): Payer: Self-pay | Admitting: *Deleted

## 2014-06-14 ENCOUNTER — Other Ambulatory Visit (HOSPITAL_COMMUNITY): Payer: Self-pay | Admitting: *Deleted

## 2014-06-14 MED ORDER — ISOSORB DINITRATE-HYDRALAZINE 20-37.5 MG PO TABS: 0.5000 | ORAL_TABLET | Freq: Three times a day (TID) | ORAL | Status: DC

## 2014-07-07 ENCOUNTER — Ambulatory Visit (INDEPENDENT_AMBULATORY_CARE_PROVIDER_SITE_OTHER): Payer: 59 | Admitting: *Deleted

## 2014-07-07 ENCOUNTER — Telehealth: Payer: Self-pay | Admitting: Cardiology

## 2014-07-07 DIAGNOSIS — I429 Cardiomyopathy, unspecified: Secondary | ICD-10-CM

## 2014-07-07 DIAGNOSIS — I5022 Chronic systolic (congestive) heart failure: Secondary | ICD-10-CM

## 2014-07-07 DIAGNOSIS — I428 Other cardiomyopathies: Secondary | ICD-10-CM

## 2014-07-07 LAB — MDC_IDC_ENUM_SESS_TYPE_REMOTE
Battery Remaining Percentage: 94 %
Battery Voltage: 3.2 V
Brady Statistic RV Percent Paced: 1 %
Date Time Interrogation Session: 20160407060018
HIGH POWER IMPEDANCE MEASURED VALUE: 65 Ohm
HighPow Impedance: 65 Ohm
Implantable Pulse Generator Serial Number: 7228287
Lead Channel Impedance Value: 460 Ohm
Lead Channel Pacing Threshold Amplitude: 1 V
Lead Channel Pacing Threshold Pulse Width: 0.5 ms
Lead Channel Sensing Intrinsic Amplitude: 10.1 mV
Lead Channel Setting Pacing Amplitude: 2.5 V
Lead Channel Setting Pacing Pulse Width: 0.5 ms
Lead Channel Setting Sensing Sensitivity: 0.5 mV
MDC IDC MSMT BATTERY REMAINING LONGEVITY: 97 mo
MDC IDC SET ZONE DETECTION INTERVAL: 250 ms
MDC IDC SET ZONE DETECTION INTERVAL: 300 ms

## 2014-07-07 NOTE — Progress Notes (Signed)
Remote ICD transmission.   

## 2014-07-07 NOTE — Telephone Encounter (Signed)
Spoke with pt and reminded pt of remote transmission that is due today. Pt verbalized understanding.   

## 2014-08-09 ENCOUNTER — Encounter: Payer: Self-pay | Admitting: Cardiology

## 2014-08-11 ENCOUNTER — Encounter: Payer: Self-pay | Admitting: Internal Medicine

## 2014-09-10 ENCOUNTER — Other Ambulatory Visit (HOSPITAL_COMMUNITY): Payer: Self-pay | Admitting: Internal Medicine

## 2014-09-12 ENCOUNTER — Other Ambulatory Visit (HOSPITAL_COMMUNITY): Payer: Self-pay | Admitting: *Deleted

## 2014-10-10 ENCOUNTER — Telehealth: Payer: Self-pay | Admitting: Cardiology

## 2014-10-10 ENCOUNTER — Encounter: Payer: 59 | Admitting: *Deleted

## 2014-10-10 NOTE — Telephone Encounter (Signed)
LMOVM reminding pt to send remote transmission.   

## 2014-10-11 ENCOUNTER — Encounter: Payer: Self-pay | Admitting: Cardiology

## 2014-10-12 ENCOUNTER — Other Ambulatory Visit (HOSPITAL_COMMUNITY): Payer: Self-pay | Admitting: Internal Medicine

## 2014-10-21 ENCOUNTER — Encounter: Payer: Self-pay | Admitting: Internal Medicine

## 2014-11-11 ENCOUNTER — Other Ambulatory Visit (HOSPITAL_COMMUNITY): Payer: Self-pay | Admitting: Internal Medicine

## 2014-12-11 ENCOUNTER — Other Ambulatory Visit (HOSPITAL_COMMUNITY): Payer: Self-pay | Admitting: Internal Medicine

## 2015-01-09 ENCOUNTER — Other Ambulatory Visit (HOSPITAL_COMMUNITY): Payer: Self-pay | Admitting: Cardiology

## 2015-02-15 ENCOUNTER — Other Ambulatory Visit: Payer: Self-pay

## 2015-02-15 MED ORDER — VALSARTAN 80 MG PO TABS: 160.0000 mg | ORAL_TABLET | Freq: Two times a day (BID) | ORAL | Status: DC

## 2015-03-17 ENCOUNTER — Ambulatory Visit (INDEPENDENT_AMBULATORY_CARE_PROVIDER_SITE_OTHER): Payer: 59 | Admitting: Internal Medicine

## 2015-03-17 ENCOUNTER — Encounter: Payer: Self-pay | Admitting: Internal Medicine

## 2015-03-17 VITALS — BP 125/78 | HR 61 | Ht 68.0 in | Wt 202.4 lb

## 2015-03-17 DIAGNOSIS — I472 Ventricular tachycardia, unspecified: Secondary | ICD-10-CM

## 2015-03-17 DIAGNOSIS — E785 Hyperlipidemia, unspecified: Secondary | ICD-10-CM

## 2015-03-17 DIAGNOSIS — I429 Cardiomyopathy, unspecified: Secondary | ICD-10-CM

## 2015-03-17 DIAGNOSIS — I5022 Chronic systolic (congestive) heart failure: Secondary | ICD-10-CM

## 2015-03-17 DIAGNOSIS — Z4502 Encounter for adjustment and management of automatic implantable cardiac defibrillator: Secondary | ICD-10-CM

## 2015-03-17 DIAGNOSIS — I428 Other cardiomyopathies: Secondary | ICD-10-CM

## 2015-03-17 LAB — CUP PACEART INCLINIC DEVICE CHECK
Brady Statistic RV Percent Paced: 0 %
Implantable Lead Location: 753860
Implantable Lead Model: 181
Lead Channel Pacing Threshold Amplitude: 0.75 V
Lead Channel Sensing Intrinsic Amplitude: 7.5 mV
Lead Channel Setting Pacing Amplitude: 2.5 V
Lead Channel Setting Pacing Pulse Width: 0.5 ms
Lead Channel Setting Sensing Sensitivity: 0.5 mV
MDC IDC LEAD IMPLANT DT: 20150925
MDC IDC LEAD SERIAL: 330358
MDC IDC MSMT BATTERY REMAINING LONGEVITY: 94.8
MDC IDC MSMT LEADCHNL RV PACING THRESHOLD PULSEWIDTH: 0.5 ms
MDC IDC SESS DTM: 20161216175317
Pulse Gen Serial Number: 7228287

## 2015-03-17 NOTE — Progress Notes (Signed)
Patient Care Team: No Pcp Per Patient as PCP - General (General Practice) Hillis Range, MD as Attending Physician (Cardiology)   HPI  Edward Greene is a 54 y.o. male Seen in repeat evaluation for routine visit, with PMHx of NICM, (preferred for SICD but declined by insurance), has single chamber ICD, HTN, HLD, chronic stable systolic CHF  He comes today for a routine visit, he feels well, denies any kind of CP, no palpitations, no dizziness, near syncope or syncope.  He describes good exertional capacity, only gets winded with carrying heavy things.  He has not been shocked by his device.   Past Medical History  Diagnosis Date  . Chronic systolic heart failure (HCC)     NICM  . Hyperlipidemia   . Hypertension   . Nonischemic cardiomyopathy (HCC) 09/30/2013  . Childhood asthma     Past Surgical History  Procedure Laterality Date  . Cardiac defibrillator placement  12/24/2013    STJ single chamber ICD implanted by Dr Graciela Husbands  . Appendectomy  1978  . Implantable cardioverter defibrillator implant N/A 12/24/2013    Procedure: IMPLANTABLE CARDIOVERTER DEFIBRILLATOR IMPLANT;  Surgeon: Duke Salvia, MD;  Location: [facility] CATH LAB;  Service: Cardiovascular;  Laterality: N/A;    Current Outpatient Prescriptions  Medication Sig Dispense Refill  . acetaminophen (TYLENOL) 325 MG tablet Take 1-2 tablets (325-650 mg total) by mouth every 4 (four) hours as needed for mild pain.    Marland Kitchen atorvastatin (LIPITOR) 20 MG tablet TAKE 1 TABLET BY MOUTH DAILY 30 tablet 2  . carvedilol (COREG) 25 MG tablet TAKE 1 TABLET BY MOUTH TWICE DAILY WITH A MEAL 60 tablet 2  . isosorbide-hydrALAZINE (BIDIL) 20-37.5 MG per tablet Take 0.5 tablets by mouth 3 (three) times daily. NEEDS OFFICE VISIT 45 tablet 0  . spironolactone (ALDACTONE) 25 MG tablet TAKE 1 TABLET BY MOUTH DAILY 30 tablet 2  . traMADol (ULTRAM) 50 MG tablet Take 1 tablet (50 mg total) by mouth every 12 (twelve) hours as needed for moderate pain  or severe pain. 10 tablet 0  . valsartan (DIOVAN) 80 MG tablet Take 2 tablets (160 mg total) by mouth 2 (two) times daily. 120 tablet 2   No current facility-administered medications for this visit.    No Known Allergies  Review of Systems negative except from HPI and PMH  Physical Exam BP 125/78 mmHg  Pulse 61  Ht 5\' 8"  (1.727 m)  Wt 202 lb 6.4 oz (91.808 kg)  BMI 30.78 kg/m2 Well developed and well nourished in no acute distress HENT normal E scleral and icterus clear Neck Supple JVP flat; carotids brisk and full Clear to ausculation  egular rate and rhythm, no murmurs gallops or rub Soft with active bowel sounds No clubbing cyanosis  Edema Alert and oriented, grossly normal motor and sensory function Skin Warm and Dry   EKG today SR, diffuse T changes appear similar to previoius, PVC  04/14/13: Echocardiogram Impressions: - Moderately dilated LV with EF 25-30%. Severe global hypokinesis. There was some prominence of the trabeculation pattern towards the apex raising the question of noncompaction. Normal RV size and systolic function. No significant valvular abnormalities.  DEVICE: St Jude ICD, serial number [id number], Dr. Graciela Husbands  Assessment and  Plan  1. NICM     STJ ICD     VT noted under his detection rate, reprogrammed with a monitor zone, will schedule an ETT to evaluate where his peak HR is and help guide device programming  His exam is euvolemic     Q 3 month Merlin transmissions      2. HTN WELL s controlled  3. HLD     On statin therapy, will check annual labs  4.  vENTRICULAR TACHYCARDIA  PT seen and examined   The patient has recurrent ventricular tachycardia below his detection zone. Morphologically it was distinct. We have reprogrammed his device to create a monitoring zone although we certainly could have created a therapy zone. We will do the latter following treadmill testing to assure that his sinus rate doesn't encroach upon his  VT rate. For now we will continue him on his current medications.  We will see him back for his ETT, f/u pending the findings, Q 3 month Merlin remote transmissions.

## 2015-03-17 NOTE — Patient Instructions (Signed)
Medication Instructions: - no changes  Labwork: - Your physician recommends that you return for FASTING lab work on day that works best for you: BMP/ Liver/ Lipids  Procedures/Testing: - Your physician has requested that you have an exercise tolerance test- with Dr. Graciela Husbands (and when Luster Landsberg is in the office if possible). For further information please visit [email or web address] Please also follow instruction sheet, as given.  Follow-Up: - Your physician wants you to follow-up in: 1 year with Dr. Graciela Husbands. You will receive a reminder letter in the mail two months in advance. If you don't receive a letter, please call our office to schedule the follow-up appointment.  Any Additional Special Instructions Will Be Listed Below (If Applicable).

## 2015-03-21 NOTE — Progress Notes (Signed)
Unable to route note,  PCP in the system.

## 2015-04-10 ENCOUNTER — Other Ambulatory Visit: Payer: 59

## 2015-04-10 ENCOUNTER — Encounter: Payer: 59 | Admitting: Internal Medicine

## 2015-04-12 ENCOUNTER — Other Ambulatory Visit (HOSPITAL_COMMUNITY): Payer: Self-pay | Admitting: Cardiology

## 2015-04-19 ENCOUNTER — Other Ambulatory Visit: Payer: Self-pay | Admitting: Cardiology

## 2015-04-20 ENCOUNTER — Telehealth: Payer: Self-pay

## 2015-04-20 NOTE — Telephone Encounter (Signed)
Prior auth for Valsartan 80mg , 2 bid, sent to [facility]

## 2015-04-24 ENCOUNTER — Telehealth: Payer: Self-pay

## 2015-04-24 NOTE — Telephone Encounter (Signed)
[employer] will only pay for Valsartan 160mg  bid, not 80 mg at 2 bid.

## 2015-04-27 ENCOUNTER — Ambulatory Visit (INDEPENDENT_AMBULATORY_CARE_PROVIDER_SITE_OTHER): Payer: 59

## 2015-04-27 ENCOUNTER — Other Ambulatory Visit (INDEPENDENT_AMBULATORY_CARE_PROVIDER_SITE_OTHER): Payer: 59 | Admitting: *Deleted

## 2015-04-27 ENCOUNTER — Encounter: Payer: 59 | Admitting: Internal Medicine

## 2015-04-27 DIAGNOSIS — I429 Cardiomyopathy, unspecified: Secondary | ICD-10-CM

## 2015-04-27 DIAGNOSIS — E785 Hyperlipidemia, unspecified: Secondary | ICD-10-CM

## 2015-04-27 DIAGNOSIS — I472 Ventricular tachycardia, unspecified: Secondary | ICD-10-CM

## 2015-04-27 DIAGNOSIS — I428 Other cardiomyopathies: Secondary | ICD-10-CM

## 2015-04-27 DIAGNOSIS — Z4502 Encounter for adjustment and management of automatic implantable cardiac defibrillator: Secondary | ICD-10-CM | POA: Diagnosis not present

## 2015-04-27 DIAGNOSIS — I5022 Chronic systolic (congestive) heart failure: Secondary | ICD-10-CM | POA: Diagnosis not present

## 2015-04-27 LAB — HEPATIC FUNCTION PANEL
ALT: 17 U/L (ref 9–46)
AST: 19 U/L (ref 10–35)
Albumin: 4.4 g/dL (ref 3.6–5.1)
Alkaline Phosphatase: 51 U/L (ref 40–115)
BILIRUBIN DIRECT: 0.2 mg/dL (ref <=0.2)
Indirect Bilirubin: 0.4 mg/dL (ref 0.2–1.2)
Total Bilirubin: 0.6 mg/dL (ref 0.2–1.2)
Total Protein: 6.8 g/dL (ref 6.1–8.1)

## 2015-04-27 LAB — EXERCISE TOLERANCE TEST: CHL CUP RESTING HR STRESS: 88 {beats}/min

## 2015-04-27 LAB — BASIC METABOLIC PANEL
BUN: 14 mg/dL (ref 7–25)
CALCIUM: 9.4 mg/dL (ref 8.6–10.3)
CO2: 24 mmol/L (ref 20–31)
Chloride: 106 mmol/L (ref 98–110)
Creat: 1.59 mg/dL — ABNORMAL HIGH (ref 0.70–1.33)
GLUCOSE: 131 mg/dL — AB (ref 65–99)
Potassium: 4 mmol/L (ref 3.5–5.3)
SODIUM: 141 mmol/L (ref 135–146)

## 2015-04-27 LAB — LIPID PANEL
CHOLESTEROL: 100 mg/dL — AB (ref 125–200)
HDL: 18 mg/dL — ABNORMAL LOW (ref >=40)
LDL Cholesterol: 25 mg/dL (ref <130)
Total CHOL/HDL Ratio: 5.6 Ratio — ABNORMAL HIGH (ref <=5.0)
Triglycerides: 285 mg/dL — ABNORMAL HIGH (ref <150)
VLDL: 57 mg/dL — AB (ref <30)

## 2015-04-27 NOTE — Addendum Note (Signed)
Addended by: Sydnee Cabal R on: 04/27/2015 01:52 PM   Modules accepted: Orders

## 2015-05-12 ENCOUNTER — Other Ambulatory Visit (HOSPITAL_COMMUNITY): Payer: Self-pay | Admitting: Cardiology

## 2015-06-12 ENCOUNTER — Other Ambulatory Visit (HOSPITAL_COMMUNITY): Payer: Self-pay | Admitting: Cardiology

## 2015-06-19 ENCOUNTER — Encounter: Payer: 59 | Admitting: *Deleted

## 2015-06-19 ENCOUNTER — Other Ambulatory Visit (HOSPITAL_COMMUNITY): Payer: Self-pay | Admitting: *Deleted

## 2015-06-19 MED ORDER — ISOSORB DINITRATE-HYDRALAZINE 20-37.5 MG PO TABS: 0.5000 | ORAL_TABLET | Freq: Three times a day (TID) | ORAL | Status: DC

## 2015-06-23 ENCOUNTER — Encounter: Payer: Self-pay | Admitting: Cardiology

## 2015-07-12 ENCOUNTER — Other Ambulatory Visit (HOSPITAL_COMMUNITY): Payer: Self-pay | Admitting: Cardiology

## 2015-07-24 ENCOUNTER — Other Ambulatory Visit: Payer: Self-pay | Admitting: Cardiology

## 2015-08-12 ENCOUNTER — Other Ambulatory Visit (HOSPITAL_COMMUNITY): Payer: Self-pay | Admitting: Cardiology

## 2015-08-26 ENCOUNTER — Other Ambulatory Visit: Payer: Self-pay | Admitting: Cardiology

## 2015-09-11 ENCOUNTER — Other Ambulatory Visit (HOSPITAL_COMMUNITY): Payer: Self-pay | Admitting: Cardiology

## 2015-09-27 ENCOUNTER — Other Ambulatory Visit: Payer: Self-pay | Admitting: Cardiology

## 2015-10-11 ENCOUNTER — Other Ambulatory Visit (HOSPITAL_COMMUNITY): Payer: Self-pay | Admitting: Cardiology

## 2015-10-25 ENCOUNTER — Other Ambulatory Visit: Payer: Self-pay | Admitting: Cardiology

## 2015-10-30 ENCOUNTER — Other Ambulatory Visit (HOSPITAL_COMMUNITY): Payer: Self-pay | Admitting: *Deleted

## 2015-10-30 MED ORDER — ISOSORB DINITRATE-HYDRALAZINE 20-37.5 MG PO TABS: 0.5000 | ORAL_TABLET | Freq: Three times a day (TID) | ORAL | 3 refills | Status: DC

## 2015-11-08 ENCOUNTER — Other Ambulatory Visit (HOSPITAL_COMMUNITY): Payer: Self-pay | Admitting: Cardiology

## 2016-02-28 ENCOUNTER — Other Ambulatory Visit (HOSPITAL_COMMUNITY): Payer: Self-pay | Admitting: Internal Medicine

## 2016-03-08 ENCOUNTER — Other Ambulatory Visit (HOSPITAL_COMMUNITY): Payer: Self-pay | Admitting: Cardiology

## 2016-04-11 ENCOUNTER — Other Ambulatory Visit (HOSPITAL_COMMUNITY): Payer: Self-pay | Admitting: Cardiology

## 2016-04-16 ENCOUNTER — Ambulatory Visit (INDEPENDENT_AMBULATORY_CARE_PROVIDER_SITE_OTHER): Payer: 59 | Admitting: Internal Medicine

## 2016-04-16 ENCOUNTER — Encounter (INDEPENDENT_AMBULATORY_CARE_PROVIDER_SITE_OTHER): Payer: Self-pay

## 2016-04-16 ENCOUNTER — Encounter: Payer: Self-pay | Admitting: Internal Medicine

## 2016-04-16 VITALS — BP 123/64 | HR 71 | Ht 67.0 in | Wt 206.0 lb

## 2016-04-16 DIAGNOSIS — Z95810 Presence of automatic (implantable) cardiac defibrillator: Secondary | ICD-10-CM

## 2016-04-16 DIAGNOSIS — I472 Ventricular tachycardia, unspecified: Secondary | ICD-10-CM

## 2016-04-16 DIAGNOSIS — I428 Other cardiomyopathies: Secondary | ICD-10-CM | POA: Diagnosis not present

## 2016-04-16 DIAGNOSIS — I5022 Chronic systolic (congestive) heart failure: Secondary | ICD-10-CM | POA: Diagnosis not present

## 2016-04-16 NOTE — Progress Notes (Signed)
      Patient Care Team: No Pcp Per Patient as PCP - General (General Practice) Hillis Range, MD as Attending Physician (Cardiology)   HPI  Edward Greene is a 56 y.o. male Seen in follow-up for ICD implantation for primary prevention with NICM and previously class 1 symptoms with VO2 Max 33.1. There had been interval deterioration of functional status.  The plan was SICD but was denied from insurance. He underwent implantation 9/15  The patient denies chest pain, shortness of breath, nocturnal dyspnea, orthopnea or peripheral edema.  There have been no palpitations, lightheadedness or syncope.   Still coaching boxing     Past Medical History:  Diagnosis Date  . Childhood asthma   . Chronic systolic heart failure (HCC)    NICM  . Hyperlipidemia   . Hypertension   . Nonischemic cardiomyopathy (HCC) 09/30/2013    Past Surgical History:  Procedure Laterality Date  . APPENDECTOMY  1978  . CARDIAC DEFIBRILLATOR PLACEMENT  12/24/2013   STJ single chamber ICD implanted by Dr Graciela Husbands  . IMPLANTABLE CARDIOVERTER DEFIBRILLATOR IMPLANT N/A 12/24/2013   Procedure: IMPLANTABLE CARDIOVERTER DEFIBRILLATOR IMPLANT;  Surgeon: Duke Salvia, MD;  Location: [facility] CATH LAB;  Service: Cardiovascular;  Laterality: N/A;    Current Outpatient Prescriptions  Medication Sig Dispense Refill  . acetaminophen (TYLENOL) 325 MG tablet Take 1-2 tablets (325-650 mg total) by mouth every 4 (four) hours as needed for mild pain.    Marland Kitchen atorvastatin (LIPITOR) 20 MG tablet TAKE 1 TABLET BY MOUTH DAILY 30 tablet 0  . BIDIL 20-37.5 MG tablet TAKE 1/2 TABLET BY MOUTH THREE TIMES DAILY 45 tablet 3  . carvedilol (COREG) 25 MG tablet TAKE 1 TABLET BY MOUTH TWICE DAILY WITH A MEAL 60 tablet 0  . spironolactone (ALDACTONE) 25 MG tablet TAKE 1 TABLET BY MOUTH DAILY 30 tablet 0  . traMADol (ULTRAM) 50 MG tablet Take 1 tablet (50 mg total) by mouth every 12 (twelve) hours as needed for moderate pain or severe pain. 10 tablet 0   . valsartan (DIOVAN) 160 MG tablet TAKE 1 TABLET BY MOUTH TWICE DAILY 60 tablet 0   No current facility-administered medications for this visit.     No Known Allergies  Review of Systems negative except from HPI and PMH  Physical Exam BP 123/64   Pulse 71   Ht 5\' 7"  (1.702 m)   Wt 206 lb (93.4 kg)   SpO2 97%   BMI 32.26 kg/m  Well developed and well nourished in no acute distress HENT normal E scleral and icterus clear Neck Supple JVP flat; carotids brisk and full Clear to ausculation  egular rate and rhythm, no murmurs gallops or rub Soft with active bowel sounds No clubbing cyanosis  Edema Alert and oriented, grossly normal motor and sensory function Skin Warm and Dry  ECG  NSR  19/10/40  Assessment and  Plan  Nonischemic cardiomyopathy   congestive heart failure-chronic-systolic Euvolemic continue current meds   Implantable defibrillator-St. Jude The patient's device was interrogated and the information was fully reviewed.  The device was reprogrammed to  Decrease outputs  Doing well. Functionally stable.  Marland Kitchen

## 2016-04-16 NOTE — Patient Instructions (Signed)

## 2016-05-03 ENCOUNTER — Other Ambulatory Visit: Payer: Self-pay | Admitting: Cardiology

## 2016-05-10 ENCOUNTER — Other Ambulatory Visit (HOSPITAL_COMMUNITY): Payer: Self-pay | Admitting: Internal Medicine

## 2016-07-03 ENCOUNTER — Other Ambulatory Visit (HOSPITAL_COMMUNITY): Payer: Self-pay | Admitting: Internal Medicine

## 2016-07-16 ENCOUNTER — Telehealth: Payer: Self-pay | Admitting: Cardiology

## 2016-07-16 ENCOUNTER — Encounter: Payer: 59 | Admitting: *Deleted

## 2016-07-16 NOTE — Telephone Encounter (Signed)
LMOVM reminding pt to send remote transmission.   

## 2016-07-19 ENCOUNTER — Encounter: Payer: Self-pay | Admitting: Cardiology

## 2016-07-23 ENCOUNTER — Other Ambulatory Visit (HOSPITAL_COMMUNITY): Payer: Self-pay | Admitting: Internal Medicine

## 2016-07-26 ENCOUNTER — Other Ambulatory Visit (HOSPITAL_COMMUNITY): Payer: Self-pay | Admitting: Internal Medicine

## 2016-07-26 NOTE — Telephone Encounter (Signed)
Followed by Edward Greene

## 2016-07-26 NOTE — Telephone Encounter (Signed)
This has been previously refilled by Dr Gala Romney. His office has forwarded the refill request back to this office with a note that the patient is followed by Dr Graciela Husbands. Okay to refill under Dr Graciela Husbands? Please advise. Thanks, MI

## 2016-07-29 NOTE — Telephone Encounter (Signed)
OK to refill

## 2016-08-02 ENCOUNTER — Other Ambulatory Visit: Payer: Self-pay | Admitting: Cardiology

## 2016-08-06 ENCOUNTER — Other Ambulatory Visit: Payer: Self-pay | Admitting: Cardiology

## 2016-11-11 ENCOUNTER — Telehealth: Payer: Self-pay

## 2016-11-11 NOTE — Telephone Encounter (Signed)
**Note De-Identified Lucciana Head Obfuscation** I attempted to do a PA for Diovan through covermymeds but received a message of: DIOVAN TAB 160MG  is denied due to Plan Exclusion.  I called phone number provided by covermymeds [phone] to do an appeal with [employer].  I s/w Latisha at [facility] and did PA for Valsartan as Diovan is excluded from the plan. Per Debbe Odea we should have a decision within 72 hrs to 15 days.

## 2016-11-18 ENCOUNTER — Telehealth: Payer: Self-pay | Admitting: Pharmacist

## 2016-11-18 ENCOUNTER — Encounter: Payer: Self-pay | Admitting: Pharmacist

## 2016-11-18 MED ORDER — LOSARTAN POTASSIUM 100 MG PO TABS: 100.0000 mg | ORAL_TABLET | Freq: Every day | ORAL | 3 refills | Status: DC

## 2016-11-18 NOTE — Telephone Encounter (Signed)
This encounter was created in error - please disregard.

## 2016-11-18 NOTE — Telephone Encounter (Signed)
Received fax from [facility] that brand Diovan is not covered. Will switch to covered losartan 100mg  daily. Pt is aware and will monitor his BP over the next few weeks.

## 2017-04-12 ENCOUNTER — Other Ambulatory Visit (HOSPITAL_COMMUNITY): Payer: Self-pay | Admitting: Internal Medicine

## 2017-05-12 ENCOUNTER — Other Ambulatory Visit (HOSPITAL_COMMUNITY): Payer: Self-pay | Admitting: Internal Medicine

## 2017-05-13 ENCOUNTER — Ambulatory Visit: Payer: 59 | Admitting: Internal Medicine

## 2017-05-13 ENCOUNTER — Encounter (INDEPENDENT_AMBULATORY_CARE_PROVIDER_SITE_OTHER): Payer: Self-pay

## 2017-05-13 ENCOUNTER — Other Ambulatory Visit (HOSPITAL_COMMUNITY): Payer: Self-pay | Admitting: Internal Medicine

## 2017-05-13 ENCOUNTER — Encounter: Payer: Self-pay | Admitting: Internal Medicine

## 2017-05-13 VITALS — BP 116/69 | HR 70 | Ht 67.0 in | Wt 204.0 lb

## 2017-05-13 DIAGNOSIS — I428 Other cardiomyopathies: Secondary | ICD-10-CM

## 2017-05-13 DIAGNOSIS — I5022 Chronic systolic (congestive) heart failure: Secondary | ICD-10-CM | POA: Diagnosis not present

## 2017-05-13 DIAGNOSIS — Z95810 Presence of automatic (implantable) cardiac defibrillator: Secondary | ICD-10-CM | POA: Diagnosis not present

## 2017-05-13 LAB — CUP PACEART INCLINIC DEVICE CHECK
Battery Remaining Longevity: 75 mo
Brady Statistic RV Percent Paced: 0 %
Date Time Interrogation Session: 20190212171802
HighPow Impedance: 70 Ohm
Implantable Lead Implant Date: 20150925
Implantable Lead Location: 753860
Implantable Lead Model: 181
Implantable Lead Serial Number: 330358
Implantable Pulse Generator Implant Date: 20150925
Lead Channel Impedance Value: 400 Ohm
Lead Channel Pacing Threshold Pulse Width: 0.5 ms
Lead Channel Setting Sensing Sensitivity: 0.5 mV
MDC IDC MSMT LEADCHNL RV PACING THRESHOLD AMPLITUDE: 1 V
MDC IDC MSMT LEADCHNL RV SENSING INTR AMPL: 8.4 mV
MDC IDC PG SERIAL: 7228287
MDC IDC SET LEADCHNL RV PACING AMPLITUDE: 2.5 V
MDC IDC SET LEADCHNL RV PACING PULSEWIDTH: 0.5 ms

## 2017-05-13 MED ORDER — SACUBITRIL-VALSARTAN 49-51 MG PO TABS: 1.0000 | ORAL_TABLET | Freq: Two times a day (BID) | ORAL | 11 refills | Status: DC

## 2017-05-13 NOTE — Patient Instructions (Addendum)
Medication Instructions:   1. Stop Losartan Potassium (Cozaar) 2. Begin Entresto 49/51mg , one tablet, twice daily   Labwork: When you return for your pharmacy appointment, please stop by the lab to have a BMP drawn.   Testing/Procedures:  Your physician has requested that you have an echocardiogram. Echocardiography is a painless test that uses sound waves to create images of your heart. It provides your doctor with information about the size and shape of your heart and how well your heart's chambers and valves are working. This procedure takes approximately one hour. There are no restrictions for this procedure.   Follow-Up: Your physician recommends that you schedule a follow-up appointment with Dr Freida Busman in the Heart Failure Clinic in the next few weeks.  In two weeks, please follow up with our pharmacist for Entresto dosing and labwork.    Remote monitoring is used to monitor your Pacemaker from home. This monitoring reduces the number of office visits required to check your device to one time per year. It allows [place] to keep an eye on the functioning of your device to ensure it is working properly. You are scheduled for a device check from home on 07/07/2017. You may send your transmission at any time that day. If you have a wireless device, the transmission will be sent automatically. After your physician reviews your transmission, you will receive a postcard with your next transmission date.   Any Other Special Instructions Will Be Listed Below (If Applicable).     If you need a refill on your cardiac medications before your next appointment, please call your pharmacy.

## 2017-05-13 NOTE — Progress Notes (Signed)
Patient Care Team: Patient, No Pcp Per as PCP - General (General Practice) Hillis Range, MD as Attending Physician (Cardiology)   HPI  Edward Greene is a 57 y.o. male Seen in follow-up for ICD implantation for primary prevention with NICM and previously class 1 symptoms with VO2 Max 33.1. There had been interval deterioration of functional status.  The plan was SICD but was denied from insurance. He underwent implantation 9/15 \ He is still coaching  He has noted increasing shortness of breath and fatigue  With daytime somnolence   DATE TEST EF   1/15 Echo   25-30 %         Date Cr K  1/17 1.59 4.0           Past Medical History:  Diagnosis Date  . Childhood asthma   . Chronic systolic heart failure (HCC)    NICM  . Hyperlipidemia   . Hypertension   . Nonischemic cardiomyopathy (HCC) 09/30/2013    Past Surgical History:  Procedure Laterality Date  . APPENDECTOMY  1978  . CARDIAC DEFIBRILLATOR PLACEMENT  12/24/2013   STJ single chamber ICD implanted by Dr Graciela Husbands  . IMPLANTABLE CARDIOVERTER DEFIBRILLATOR IMPLANT N/A 12/24/2013   Procedure: IMPLANTABLE CARDIOVERTER DEFIBRILLATOR IMPLANT;  Surgeon: Duke Salvia, MD;  Location: [facility] CATH LAB;  Service: Cardiovascular;  Laterality: N/A;    Current Outpatient Medications  Medication Sig Dispense Refill  . atorvastatin (LIPITOR) 20 MG tablet Take 1 tablet (20 mg total) by mouth daily. Please keep upcoming appt for future refills. Thank you 30 tablet 0  . BIDIL 20-37.5 MG tablet TAKE HALF TABLET BY MOUTH THREE TIMES DAILY DAILY. KEEP APPOINTMENT 45 tablet 0  . carvedilol (COREG) 25 MG tablet Take 1 tablet (25 mg total) by mouth 2 (two) times daily with a meal. Please keep upcoming appt for future refills. Thank you 60 tablet 0  . losartan (COZAAR) 100 MG tablet Take 1 tablet (100 mg total) by mouth daily. 90 tablet 3  . spironolactone (ALDACTONE) 25 MG tablet TAKE 1 TABLET BY MOUTH EVERY DAY 30 tablet 0   No  current facility-administered medications for this visit.     No Known Allergies  Review of Systems negative except from HPI and PMH  Physical Exam BP 116/69   Pulse 70   Ht 5\' 7"  (1.702 m)   Wt 204 lb (92.5 kg)   BMI 31.95 kg/m  Well developed and nourished in no acute distress HENT normal Neck supple with JVP-flat Carotids brisk and full without bruits Clear Regular rate and rhythm, no murmurs or gallops Abd-soft with active BS without hepatomegaly No Clubbing cyanosis edema Skin-warm and dry A & Oriented  Grossly normal sensory and motor function   ECG   Sinus @ 70  18/11/40  Assessment and  Plan  Nonischemic cardiomyopathy   congestive heart failure-acute//chronic-systolic   Ventricular tachycardia nonsustained   Sleep disordered breathing  Renal insufficiency grade 3  Implantable defibrillator-St. Jude  .    The patient is having increasing fatigue and shortness of breath..  He has tolerated his cardiomyopathy for a long time.  There are number of issues.  We will recheck his LV function.  I will have him follow-up with Dr. Shirlee Latch and he might benefit from right heart catheterization and CPX  We will change him from [facility]  We will arrange a sleep study  Keep a close follow-up of his electrolytes.  We spent more than 50% of  our >25 min visit in face to face counseling regarding the above

## 2017-05-15 ENCOUNTER — Telehealth (HOSPITAL_COMMUNITY): Payer: Self-pay | Admitting: Cardiology

## 2017-05-15 NOTE — Telephone Encounter (Signed)
Called and left VM for patient to call back.  Need to give patient appt info for New CHF appt with Dr. Shirlee Latch and will mail new patient packet to patient after he calls back.

## 2017-05-19 ENCOUNTER — Telehealth: Payer: Self-pay

## 2017-05-19 NOTE — Telephone Encounter (Signed)
**Note De-identified Edward Greene Obfuscation** I have done an Entresto PA through covermymeds. 

## 2017-05-19 NOTE — Telephone Encounter (Signed)
Approval on [employer] PA received via fax from [employer]. Approval good from 05/19/2017 until 05/19/2018.  [id number]  I have notified [facility] pharmacy.

## 2017-05-29 ENCOUNTER — Other Ambulatory Visit (HOSPITAL_COMMUNITY): Payer: 59

## 2017-05-29 ENCOUNTER — Ambulatory Visit: Payer: 59

## 2017-05-29 ENCOUNTER — Other Ambulatory Visit (HOSPITAL_COMMUNITY): Payer: Self-pay | Admitting: Internal Medicine

## 2017-05-29 ENCOUNTER — Other Ambulatory Visit: Payer: 59

## 2017-06-03 ENCOUNTER — Ambulatory Visit (HOSPITAL_COMMUNITY)
Admission: RE | Admit: 2017-06-03 | Discharge: 2017-06-03 | Disposition: A | Discharge status: Home / Self Care | Payer: 59 | Source: Ambulatory Visit | Attending: Cardiology | Admitting: Cardiology

## 2017-06-03 ENCOUNTER — Encounter (HOSPITAL_COMMUNITY): Payer: Self-pay | Admitting: Cardiology

## 2017-06-03 VITALS — BP 110/72 | HR 77 | Wt 201.8 lb

## 2017-06-03 DIAGNOSIS — R0600 Dyspnea, unspecified: Secondary | ICD-10-CM | POA: Diagnosis not present

## 2017-06-03 DIAGNOSIS — I5022 Chronic systolic (congestive) heart failure: Secondary | ICD-10-CM | POA: Diagnosis not present

## 2017-06-03 DIAGNOSIS — Z95811 Presence of heart assist device: Secondary | ICD-10-CM | POA: Diagnosis not present

## 2017-06-03 DIAGNOSIS — J45909 Unspecified asthma, uncomplicated: Secondary | ICD-10-CM | POA: Diagnosis not present

## 2017-06-03 DIAGNOSIS — Z79899 Other long term (current) drug therapy: Secondary | ICD-10-CM | POA: Insufficient documentation

## 2017-06-03 DIAGNOSIS — I428 Other cardiomyopathies: Secondary | ICD-10-CM

## 2017-06-03 DIAGNOSIS — E785 Hyperlipidemia, unspecified: Secondary | ICD-10-CM | POA: Diagnosis not present

## 2017-06-03 DIAGNOSIS — R079 Chest pain, unspecified: Secondary | ICD-10-CM | POA: Diagnosis not present

## 2017-06-03 DIAGNOSIS — N189 Chronic kidney disease, unspecified: Secondary | ICD-10-CM | POA: Insufficient documentation

## 2017-06-03 DIAGNOSIS — I429 Cardiomyopathy, unspecified: Secondary | ICD-10-CM | POA: Insufficient documentation

## 2017-06-03 LAB — CBC
HEMATOCRIT: 41.1 % (ref 39.0–52.0)
HEMOGLOBIN: 12.7 g/dL — AB (ref 13.0–17.0)
MCH: 28.3 pg (ref 26.0–34.0)
MCHC: 30.9 g/dL (ref 30.0–36.0)
MCV: 91.7 fL (ref 78.0–100.0)
Platelets: 150 10*3/uL (ref 150–400)
RBC: 4.48 MIL/uL (ref 4.22–5.81)
RDW: 13.3 % (ref 11.5–15.5)
WBC: 4.4 10*3/uL (ref 4.0–10.5)

## 2017-06-03 LAB — LIPID PANEL
Cholesterol: 160 mg/dL (ref 0–200)
HDL: 21 mg/dL — ABNORMAL LOW (ref >40)
LDL CALC: 67 mg/dL (ref 0–99)
TRIGLYCERIDES: 360 mg/dL — AB (ref <150)
Total CHOL/HDL Ratio: 7.6 RATIO
VLDL: 72 mg/dL — AB (ref 0–40)

## 2017-06-03 LAB — HEPATIC FUNCTION PANEL
ALBUMIN: 4.1 g/dL (ref 3.5–5.0)
ALT: 19 U/L (ref 17–63)
AST: 21 U/L (ref 15–41)
Alkaline Phosphatase: 62 U/L (ref 38–126)
TOTAL PROTEIN: 6.8 g/dL (ref 6.5–8.1)
Total Bilirubin: 0.6 mg/dL (ref 0.3–1.2)

## 2017-06-03 LAB — BASIC METABOLIC PANEL WITH GFR
Anion gap: 8 (ref 5–15)
BUN: 18 mg/dL (ref 6–20)
CO2: 24 mmol/L (ref 22–32)
Calcium: 9.2 mg/dL (ref 8.9–10.3)
Chloride: 110 mmol/L (ref 101–111)
Creatinine, Ser: 1.46 mg/dL — ABNORMAL HIGH (ref 0.61–1.24)
GFR calc Af Amer: 60 mL/min — ABNORMAL LOW
GFR calc non Af Amer: 52 mL/min — ABNORMAL LOW
Glucose, Bld: 121 mg/dL — ABNORMAL HIGH (ref 65–99)
Potassium: 3.9 mmol/L (ref 3.5–5.1)
Sodium: 142 mmol/L (ref 135–145)

## 2017-06-03 LAB — BRAIN NATRIURETIC PEPTIDE: B Natriuretic Peptide: 11.8 pg/mL (ref 0.0–100.0)

## 2017-06-03 MED ORDER — ISOSORB DINITRATE-HYDRALAZINE 20-37.5 MG PO TABS: ORAL_TABLET | ORAL | 3 refills | Status: DC

## 2017-06-03 MED ORDER — ASPIRIN EC 81 MG PO TBEC: 81.0000 mg | DELAYED_RELEASE_TABLET | Freq: Every day | ORAL | 3 refills | Status: DC

## 2017-06-03 NOTE — Progress Notes (Signed)
Patient ID: Edward Greene, male   DOB: [birth year], 57 y.o.   MRN: [id number]  Cardiology: Dr. Shirlee Greene  57 y.o. with minimal past history presented initially after hospitalization with acute systolic CHF.  In 9/12, patient began to notice dyspnea with heavy exertion, such as when he carried something up a flight of steps. He also was having difficulty sleeping with symptoms that sounded consistent with orthopnea.  No chest pain.  He went to urgent care for evaluation, and CXR showed pulmonary edema.  BNP was elevated and ECG was abnormal.  He was sent to the ER.  Evaluation showed CHF with volume overload.  Echo showed EF 15% with moderate to severe LV dilation and multiple wall motion abnormalities.  Cardiac enzymes were negative.  Patient was diuresed and started on cardiac meds.  Left heart cath in 9/12 showed no angiographic CAD.  Cardiac MRI (9/12) showed EF 15%, moderate to severe LV dilation, mild RV dysfunction, and no delayed enhancement.  Cardiopulmonary exercise test, however, showed no significant functional limitation with VO2 max 33.6 and normal VE/VCO2 slope.  Echo 3/13 showed EF 25-30%.  Last echo in 1/15 showed EF remained 25-30% with moderate LV dilation and global hypokinesis.  There were prominent apical trabeculations concerning for possilbe noncompaction, RV was normal.  CPX was repeated in 2/15.  Peak VO2 was good at 26 but lower than prior.  VE/VCO2 slope 27 with RER 1.26.  He was seen by Dr. Graciela Greene and had [facility] ICD placed.    Mr Edward Greene was referred by Dr. Graciela Greene for evaluation of CHF.  I saw him last > 3 years ago.  Since that time, he had done quite well for a long while, but over the last 2 months, he has developed increased exertional dyspnea.  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  Generally, he is very active.  However, over the last 2 months he has noted dyspnea with jogging and with carrying a  heavy load (40-50 lbs).  He had no problems with these activities in the past.  Mild dyspnea walking fast up stairs.  He is more fatigued when he lifts weights.  He does report a lot of stress at work currently.  No orthopnea/PND, no palpitations.    St Jude ICD interrogation: Impedance stable (not suggestive of volume overload), no VT.   ECG (2/19, personally reviewed): NSR, inferior and anterolateral TWIs (present on prior ECG).     Labs (9/12): K 4.3, creatinine 1.7 => 1.6, pro-BNP 907=>218, SPEP negative, HIV negative, transferrin saturation 47%, LDL 145, HDL 33 Labs (10/12): K 3.5, creatinine 1.4, LDL 55, HDL 38, BNP 81 Labs (11/12): K 4, creatinine 1.4 Labs (3/15): K 3.9, creatinine 1.6 Labs (6/15): K 4.2, creatinine 1.45, LDL 38, HDL 20 Labs (1/17): K 4, creatinine 1.59  PMH: 1. Childhood asthma 2. Hyperlipidemia 3. Systolic CHF: Echo (9/12) with EF 15%, moderate-severe LV dilation, inferior/posterior/inferoseptal/apical akinesis, septal/anterolateral severe hypokinesis, mild to moderate RV dilation with normal RV systolic function.  HIV negative, SPEP negative.  No history of ETOH or drugs.  No history of HTN.  LHC (9/12) with no angiographic CAD. Possible viral myocarditis versus familial cardiomyopathy.  Cardiac MRI (9/12) with EF 15%, moderate to severe LV dilation, mild RV dysfunction, no myocardial delayed enhancement.  Echo (1/13): EF 15%, diffuse HK, grade I diastolic dysfunction.  CPX (1/13): VO2 max 33.6, VE/VCO2 normal.  Echo (  3/13): EF 25-30%, global hypokinesis, normal RV.  Echo (1/15): EF 25-30%, moderate LV dilation, global hypokinesis, prominent apical trabeculations, normal RV.  CPX (2/15): RER 1.26, peak VO2 26, VE/VCO2 27.  - St Jude ICD 4. ACEI cough 5. CKD 6. H/o NSVT  FH: Father with CHF in his 81s, thinks he had an MI.  Father has PCM.  Father had 9 brothers, all had MIs or CHF.  Sister with MI in her 8s.   SH: Lives in [place] with his wife.  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  No tobacco, no ETOH, no no drugs.   ROS: All systems reviewed and negative except as per HPI.   Current Outpatient Medications  Medication Sig Dispense Refill  . atorvastatin (LIPITOR) 20 MG tablet TAKE 1 TABLET BY MOUTH EVERY DAY 30 tablet 10  . carvedilol (COREG) 25 MG tablet TAKE ONE TABLET BY MOUTH TWICE DAILY MEALS. KEEP APPOINTMENT FOR FURTURE REFILLS 60 tablet 10  . isosorbide-hydrALAZINE (BIDIL) 20-37.5 MG tablet Take 1 Tablet Three times Daily 90 tablet 3  . sacubitril-valsartan (ENTRESTO) 49-51 MG Take 1 tablet by mouth 2 (two) times daily. 60 tablet 11  . spironolactone (ALDACTONE) 25 MG tablet TAKE 1 TABLET BY MOUTH EVERY DAY 30 tablet 0   No current facility-administered medications for this encounter.     BP 110/72   Pulse 77   Wt 201 lb 12.8 oz (91.5 kg)   SpO2 94%   BMI 31.61 kg/m  General: NAD Neck: No JVD, no thyromegaly or thyroid nodule.  Lungs: Clear to auscultation bilaterally with normal respiratory effort. CV: Nondisplaced PMI.  Heart regular S1/S2, no S3/S4, no murmur.  No peripheral edema.  No carotid bruit.  Normal pedal pulses.  Abdomen: Soft, nontender, no hepatosplenomegaly, no distention.  Skin: Intact without lesions or rashes.  Neurologic: Alert and oriented x 3.  Psych: Normal affect. Extremities: No clubbing or cyanosis.  HEENT: Normal.   Assessment/Plan: 1. Chronic systolic CHF: Nonischemic cardiomyopathy.  ?Viral myocarditis versus familial cardiomyopathy.  Prominent apical trabeculations on last echo raise concern for noncompaction also.  EF was 25-30% by last echo in 1/15.  He has a [employer] ICD. CPX in 2/15 showed overall normal functional capacity but he did not do as well as in 2013.  Currently, NYHA class II symptoms but clearly more symptomatic over the last month than he had been before.  He is not volume overloaded by exam or Corevue.  - No Lasix needed at this time.  - Continue  Coreg 25 bid, Entresto 49/51 bid, spironolactone 25 daily.  - Increase Bidil to 1 tab tid.  - I will get a repeat echo to reassess LV function.  - I will arrange for a CPX to assess functional capacity.  - BMET today.  He will need to be more regular with labwork in the future given use of spironolactone.  - It is certainly possible that increased stress and less regular exercise are influencing his symptoms.  2. Hyperlipidemia: check lipids, he is on atorvastatin.   Followup with me in 1 month.    Marca Ancona 06/03/2017

## 2017-06-03 NOTE — Patient Instructions (Signed)
Labs today (will call for abnormal results, otherwise no news is good news)  INCREASE Bidil to 1 Tablet Three times daily  Echocardiogram has been ordered for you, we will schedule at checkout.  Cardiopulmonary test has been ordered for you, please see attached instruction sheet.  Dr. Shirlee Latch wants to see you back in 1 month.

## 2017-06-04 ENCOUNTER — Encounter (HOSPITAL_COMMUNITY): Payer: Self-pay

## 2017-06-04 ENCOUNTER — Telehealth (HOSPITAL_COMMUNITY): Payer: Self-pay

## 2017-06-04 DIAGNOSIS — E785 Hyperlipidemia, unspecified: Secondary | ICD-10-CM

## 2017-06-04 MED ORDER — ICOSAPENT ETHYL 1 G PO CAPS: 2.0000 g | ORAL_CAPSULE | Freq: Two times a day (BID) | ORAL | 3 refills | Status: DC

## 2017-06-04 NOTE — Telephone Encounter (Signed)
Notes recorded by Teresa Coombs, RN on 06/04/2017 at 10:17 AM EST Pt aware of results, agreeable to med changes (changes made in [facility]), Lab appointment made (orders placed), and Lipid Letter mailed to patient  ------  Notes recorded by Laurey Morale, MD on 06/03/2017 at 4:00 PM EST Creatinine and K stable. Triglycerides very high, needs to take Vascepa 2 g bid. If insurance will not cover, start fenofibrate 48 mg daily. Lipids in 2 months.

## 2017-06-06 ENCOUNTER — Encounter (HOSPITAL_COMMUNITY): Payer: Self-pay | Admitting: Pharmacist

## 2017-06-12 ENCOUNTER — Ambulatory Visit: Payer: 59

## 2017-06-12 ENCOUNTER — Other Ambulatory Visit: Payer: 59

## 2017-06-12 ENCOUNTER — Encounter (INDEPENDENT_AMBULATORY_CARE_PROVIDER_SITE_OTHER): Payer: Self-pay

## 2017-06-12 ENCOUNTER — Other Ambulatory Visit: Payer: Self-pay

## 2017-06-12 ENCOUNTER — Ambulatory Visit (HOSPITAL_COMMUNITY): Payer: 59 | Attending: Cardiovascular Disease

## 2017-06-12 DIAGNOSIS — I5022 Chronic systolic (congestive) heart failure: Secondary | ICD-10-CM

## 2017-06-12 DIAGNOSIS — Z95810 Presence of automatic (implantable) cardiac defibrillator: Secondary | ICD-10-CM | POA: Diagnosis not present

## 2017-06-12 DIAGNOSIS — E785 Hyperlipidemia, unspecified: Secondary | ICD-10-CM | POA: Insufficient documentation

## 2017-06-12 DIAGNOSIS — I429 Cardiomyopathy, unspecified: Secondary | ICD-10-CM | POA: Insufficient documentation

## 2017-06-12 DIAGNOSIS — I428 Other cardiomyopathies: Secondary | ICD-10-CM

## 2017-06-12 NOTE — Progress Notes (Deleted)
Patient ID: FAMOUS SPELLER                 DOB: Feb 01, 1961                      MRN: [id number]     HPI: Edward Greene is a 57 y.o. male referred by Dr. Graciela Husbands to pharmacy clinic for HF medication optimization. PMH is significant for HFrEF with LVEF of 15% on cardiac MRI 12/2010, improved to 25-30% on 05/2011 echo, then 25-30% on 04/2013 echo. He was seen in HF clinic 06/03/17 and Bidil was increased to 1 tab TID. He was also started on Vascepa 2g BID due to elevated TG of 360. F/u echo scheduled for this afternoon. Pt presents today to discuss Entresto titration per Dr Graciela Husbands.  Increase entresto if BP allows  Current CHF meds: carvedilol 25mg  BID, Entresto 49-51mg  BID, spironolactone 25mg  daily, Bidil 20-37.5mg  TID Previously tried:  BP goal: <130/55mmHg  Family History: Father with CHF in his 77s, thinks he had an MI.  Father has PCM.  Father had 9 brothers, all had MIs or CHF.  Sister with MI in her 85s.   Social History:   Diet:   Exercise: Coaches boxing  Home BP readings:   Wt Readings from Last 3 Encounters:  06/03/17 201 lb 12.8 oz (91.5 kg)  05/13/17 204 lb (92.5 kg)  04/16/16 206 lb (93.4 kg)   BP Readings from Last 3 Encounters:  06/03/17 110/72  05/13/17 116/69  04/16/16 123/64   Pulse Readings from Last 3 Encounters:  06/03/17 77  05/13/17 70  04/16/16 71    Renal function: Estimated Creatinine Clearance: 60.2 mL/min (A) (by C-G formula based on SCr of 1.46 mg/dL (H)).  Past Medical History:  Diagnosis Date  . Childhood asthma   . Chronic systolic heart failure (HCC)    NICM  . Hyperlipidemia   . Hypertension   . Nonischemic cardiomyopathy (HCC) 09/30/2013    Current Outpatient Medications on File Prior to Visit  Medication Sig Dispense Refill  . atorvastatin (LIPITOR) 20 MG tablet TAKE 1 TABLET BY MOUTH EVERY DAY 30 tablet 10  . carvedilol (COREG) 25 MG tablet TAKE ONE TABLET BY MOUTH TWICE DAILY MEALS. KEEP APPOINTMENT FOR FURTURE REFILLS 60 tablet 10   . Icosapent Ethyl (VASCEPA) 1 g CAPS Take 2 capsules (2 g total) by mouth 2 (two) times daily. 120 capsule 3  . isosorbide-hydrALAZINE (BIDIL) 20-37.5 MG tablet Take 1 Tablet Three times Daily 90 tablet 3  . sacubitril-valsartan (ENTRESTO) 49-51 MG Take 1 tablet by mouth 2 (two) times daily. 60 tablet 11  . spironolactone (ALDACTONE) 25 MG tablet TAKE 1 TABLET BY MOUTH EVERY DAY 30 tablet 0   No current facility-administered medications on file prior to visit.     No Known Allergies   Assessment/Plan:  1. Hypertension -

## 2017-06-13 ENCOUNTER — Telehealth: Payer: Self-pay

## 2017-06-13 LAB — BASIC METABOLIC PANEL
BUN / CREAT RATIO: 15 (ref 9–20)
BUN: 29 mg/dL — AB (ref 6–24)
CHLORIDE: 109 mmol/L — AB (ref 96–106)
CO2: 21 mmol/L (ref 20–29)
CREATININE: 1.9 mg/dL — AB (ref 0.76–1.27)
Calcium: 9.1 mg/dL (ref 8.7–10.2)
GFR calc Af Amer: 44 mL/min/{1.73_m2} — ABNORMAL LOW (ref >59)
GFR calc non Af Amer: 38 mL/min/{1.73_m2} — ABNORMAL LOW (ref >59)
GLUCOSE: 104 mg/dL — AB (ref 65–99)
Potassium: 4.4 mmol/L (ref 3.5–5.2)
Sodium: 145 mmol/L — ABNORMAL HIGH (ref 134–144)

## 2017-06-13 MED ORDER — SACUBITRIL-VALSARTAN 24-26 MG PO TABS: 1.0000 | ORAL_TABLET | Freq: Two times a day (BID) | ORAL | 1 refills | Status: DC

## 2017-06-13 NOTE — Telephone Encounter (Signed)
LM with patient regarding medication change and follow up date with the [facility] clinic with our pharmacy. Awaiting a return call.

## 2017-06-13 NOTE — Telephone Encounter (Signed)
Pt will be in today to pick up his samples; He is ok with keeping his pharmacy f/up on 4/8 at 3pm. Pt could not come in any earlier due to work scheduling. I specifically instructed him to discontinue his current dose of Entresto and begin his new dosing of 24/26 Entresto twice per day. He verbalized understanding and had no additional questions.

## 2017-06-13 NOTE — Telephone Encounter (Signed)
-----   Message from Duke Salvia, MD sent at 06/13/2017  8:46 AM EDT ----- Please Inform Patient that labs are normal worseing renal function  Lets have him decrease losartan to 50 mg daily and aldactone to 12.5 mg daily and recheck in 4 weeks  Thanks

## 2017-06-14 ENCOUNTER — Other Ambulatory Visit (HOSPITAL_COMMUNITY): Payer: Self-pay | Admitting: Internal Medicine

## 2017-06-16 ENCOUNTER — Telehealth (HOSPITAL_COMMUNITY): Payer: Self-pay | Admitting: Pharmacist

## 2017-06-16 MED ORDER — FENOFIBRATE 48 MG PO TABS: 48.0000 mg | ORAL_TABLET | Freq: Every day | ORAL | 5 refills | Status: DC

## 2017-06-16 NOTE — Telephone Encounter (Signed)
Called patient and he is aware, no further questions.

## 2017-06-16 NOTE — Telephone Encounter (Signed)
Vascepa denied by [facility]. Will initiate on fenofibrate 48 mg daily per Dr. Shirlee Latch.   Edward Greene. Edward Greene, PharmD, BCPS, CPP Clinical Pharmacist Phone: [phone] 06/16/2017 10:31 AM

## 2017-06-17 ENCOUNTER — Encounter (HOSPITAL_COMMUNITY): Payer: 59

## 2017-06-17 ENCOUNTER — Other Ambulatory Visit (HOSPITAL_COMMUNITY): Payer: 59

## 2017-06-17 ENCOUNTER — Telehealth (HOSPITAL_COMMUNITY): Payer: Self-pay | Admitting: *Deleted

## 2017-06-17 NOTE — Telephone Encounter (Signed)
Patient was contacted regarding no show appointment for CPX testing this morning 06/17/17 at 11am. Patient was rescheduled for 07/02/17 at 2pm with verbal instructions and expectations of testing. Patient agreeable with plan.    Lesia Hausen, MS, ACSM-RCEP 06/17/2017 4:22 PM

## 2017-06-30 ENCOUNTER — Ambulatory Visit: Payer: 59

## 2017-07-03 ENCOUNTER — Encounter (HOSPITAL_COMMUNITY): Payer: 59

## 2017-07-07 ENCOUNTER — Ambulatory Visit: Payer: 59

## 2017-07-07 ENCOUNTER — Other Ambulatory Visit: Payer: Self-pay | Admitting: *Deleted

## 2017-07-07 ENCOUNTER — Telehealth: Payer: Self-pay | Admitting: Internal Medicine

## 2017-07-07 ENCOUNTER — Telehealth: Payer: Self-pay | Admitting: Pharmacist

## 2017-07-07 ENCOUNTER — Other Ambulatory Visit: Payer: 59 | Admitting: *Deleted

## 2017-07-07 DIAGNOSIS — I5022 Chronic systolic (congestive) heart failure: Secondary | ICD-10-CM

## 2017-07-07 NOTE — Telephone Encounter (Signed)
Talked to patient today. He will be coming to [facility] office for blood work today, then have ENTRESTO dose adjusted by Heart Failure clinic.    Patient agreed with plan. Already has f/u visit scheduled with HF clinic in 1 week.

## 2017-07-07 NOTE — Telephone Encounter (Signed)
Note patient on pharmacist schedule for Entresto titration.   Entresto dose was decreased from 49/51 to 24/26mg  3 weeks ago due to worsening in renal function. Will repeat blood work today, but no office visit needed.    Entresto dose will be re-assess once BMET resulted.  Also noted patient has OV woth heart failure clinic next week.

## 2017-07-07 NOTE — Progress Notes (Deleted)
Patient ID: NATTHAN Greene                 DOB: 1961-03-30                      MRN: [id number]     HPI: Edward Greene is a 57 y.o. male referred by Dr. Shirlee Latch to HTN clinic.  Current HTN meds:  Previously tried:  BP goal:   Family History:   Social History:   Diet:   Exercise:   Home BP readings:   Wt Readings from Last 3 Encounters:  06/03/17 201 lb 12.8 oz (91.5 kg)  05/13/17 204 lb (92.5 kg)  04/16/16 206 lb (93.4 kg)   BP Readings from Last 3 Encounters:  06/03/17 110/72  05/13/17 116/69  04/16/16 123/64   Pulse Readings from Last 3 Encounters:  06/03/17 77  05/13/17 70  04/16/16 71    Renal function: CrCl cannot be calculated (Patient's most recent lab result is older than the maximum 21 days allowed.).  Past Medical History:  Diagnosis Date  . Childhood asthma   . Chronic systolic heart failure (HCC)    NICM  . Hyperlipidemia   . Hypertension   . Nonischemic cardiomyopathy (HCC) 09/30/2013    Current Outpatient Medications on File Prior to Visit  Medication Sig Dispense Refill  . atorvastatin (LIPITOR) 20 MG tablet TAKE 1 TABLET BY MOUTH EVERY DAY 30 tablet 10  . carvedilol (COREG) 25 MG tablet TAKE ONE TABLET BY MOUTH TWICE DAILY MEALS. KEEP APPOINTMENT FOR FURTURE REFILLS 60 tablet 10  . fenofibrate (TRICOR) 48 MG tablet Take 1 tablet (48 mg total) by mouth daily. 30 tablet 5  . isosorbide-hydrALAZINE (BIDIL) 20-37.5 MG tablet Take 1 Tablet Three times Daily 90 tablet 3  . sacubitril-valsartan (ENTRESTO) 24-26 MG Take 1 tablet by mouth 2 (two) times daily. 60 tablet 1  . spironolactone (ALDACTONE) 25 MG tablet TAKE 1 TABLET BY MOUTH EVERY DAY 90 tablet 3   No current facility-administered medications on file prior to visit.     No Known Allergies  There were no vitals taken for this visit.  No problem-specific Assessment & Plan notes found for this encounter.  Edward Greene PharmD, BCPS, CPP [facility]  [facility] [address] [place] [address] 07/07/2017 8:00 AM

## 2017-07-07 NOTE — Telephone Encounter (Signed)
New Message: ° ° ° ° ° ° °Pt is returning a call °

## 2017-07-08 LAB — BASIC METABOLIC PANEL
BUN / CREAT RATIO: 9 (ref 9–20)
BUN: 16 mg/dL (ref 6–24)
CO2: 21 mmol/L (ref 20–29)
CREATININE: 1.73 mg/dL — AB (ref 0.76–1.27)
Calcium: 9.4 mg/dL (ref 8.7–10.2)
Chloride: 106 mmol/L (ref 96–106)
GFR, EST AFRICAN AMERICAN: 50 mL/min/{1.73_m2} — AB (ref >59)
GFR, EST NON AFRICAN AMERICAN: 43 mL/min/{1.73_m2} — AB (ref >59)
Glucose: 121 mg/dL — ABNORMAL HIGH (ref 65–99)
POTASSIUM: 4.2 mmol/L (ref 3.5–5.2)
SODIUM: 143 mmol/L (ref 134–144)

## 2017-07-15 ENCOUNTER — Encounter (HOSPITAL_COMMUNITY): Payer: Self-pay | Admitting: Cardiology

## 2017-07-15 ENCOUNTER — Other Ambulatory Visit: Payer: Self-pay

## 2017-07-15 ENCOUNTER — Ambulatory Visit (HOSPITAL_COMMUNITY)
Admission: RE | Admit: 2017-07-15 | Discharge: 2017-07-15 | Disposition: A | Discharge status: Home / Self Care | Payer: 59 | Source: Ambulatory Visit | Attending: Cardiology | Admitting: Cardiology

## 2017-07-15 VITALS — BP 96/59 | HR 82 | Wt 195.8 lb

## 2017-07-15 DIAGNOSIS — Z8249 Family history of ischemic heart disease and other diseases of the circulatory system: Secondary | ICD-10-CM | POA: Insufficient documentation

## 2017-07-15 DIAGNOSIS — Z79899 Other long term (current) drug therapy: Secondary | ICD-10-CM | POA: Diagnosis not present

## 2017-07-15 DIAGNOSIS — E785 Hyperlipidemia, unspecified: Secondary | ICD-10-CM

## 2017-07-15 DIAGNOSIS — I428 Other cardiomyopathies: Secondary | ICD-10-CM | POA: Insufficient documentation

## 2017-07-15 DIAGNOSIS — N182 Chronic kidney disease, stage 2 (mild): Secondary | ICD-10-CM | POA: Insufficient documentation

## 2017-07-15 DIAGNOSIS — Z95810 Presence of automatic (implantable) cardiac defibrillator: Secondary | ICD-10-CM | POA: Diagnosis not present

## 2017-07-15 DIAGNOSIS — I5022 Chronic systolic (congestive) heart failure: Secondary | ICD-10-CM | POA: Diagnosis not present

## 2017-07-15 LAB — BASIC METABOLIC PANEL
ANION GAP: 7 (ref 5–15)
BUN: 17 mg/dL (ref 6–20)
CALCIUM: 9.2 mg/dL (ref 8.9–10.3)
CO2: 26 mmol/L (ref 22–32)
Chloride: 106 mmol/L (ref 101–111)
Creatinine, Ser: 1.85 mg/dL — ABNORMAL HIGH (ref 0.61–1.24)
GFR calc Af Amer: 45 mL/min — ABNORMAL LOW (ref >60)
GFR, EST NON AFRICAN AMERICAN: 39 mL/min — AB (ref >60)
Glucose, Bld: 111 mg/dL — ABNORMAL HIGH (ref 65–99)
Potassium: 4.2 mmol/L (ref 3.5–5.1)
Sodium: 139 mmol/L (ref 135–145)

## 2017-07-15 MED ORDER — ISOSORB DINITRATE-HYDRALAZINE 20-37.5 MG PO TABS: ORAL_TABLET | ORAL | 3 refills | Status: DC

## 2017-07-15 NOTE — Patient Instructions (Signed)
Decrease Bidil 1/2 tab, three times a day  Labs drawn today (if we do not call you, then your lab work was stable)   Your physician recommends that you return for lab work in: 3 months   Your physician recommends that you schedule a follow-up appointment in: 6 months with Dr. Shirlee Latch  Please Call an Schedule Appointment

## 2017-07-16 NOTE — Progress Notes (Signed)
Patient ID: Edward Greene, male   DOB: [birth year], 57 y.o.   MRN: [id number]  Cardiology: Dr. Shirlee Latch  57 y.o. with minimal past history presented initially after hospitalization with acute systolic CHF.  In 9/12, patient began to notice dyspnea with heavy exertion, such as when he carried something up a flight of steps. He also was having difficulty sleeping with symptoms that sounded consistent with orthopnea.  No chest pain.  He went to urgent care for evaluation, and CXR showed pulmonary edema.  BNP was elevated and ECG was abnormal.  He was sent to the ER.  Evaluation showed CHF with volume overload.  Echo showed EF 15% with moderate to severe LV dilation and multiple wall motion abnormalities.  Cardiac enzymes were negative.  Patient was diuresed and started on cardiac meds.  Left heart cath in 9/12 showed no angiographic CAD.  Cardiac MRI (9/12) showed EF 15%, moderate to severe LV dilation, mild RV dysfunction, and no delayed enhancement.  Cardiopulmonary exercise test, however, showed no significant functional limitation with VO2 max 33.6 and normal VE/VCO2 slope.  Echo 3/13 showed EF 25-30%.  Last echo in 1/15 showed EF remained 25-30% with moderate LV dilation and global hypokinesis.  There were prominent apical trabeculations concerning for possilbe noncompaction, RV was normal.  CPX was repeated in 2/15.  Peak VO2 was good at 26 but lower than prior.  VE/VCO2 slope 27 with RER 1.26.  He was seen by Dr. Graciela Husbands and had [facility] ICD placed.    He returns for followup of CHF.  Echo was done today showing increase in EF to 55-60%, significant improvement.  Since last appointment, I decreased Entresto due to elevated creatinine.  He has been trying to get more exercise and has noticed that his dyspnea seems improved.  Weight is down 6 lbs. No dyspnea with moderate exertion. No orthopnea/PND.  No chest pain.  No lightheadedness/dizziness.    Labs (9/12): K 4.3, creatinine 1.7 => 1.6, pro-BNP 907=>218,  SPEP negative, HIV negative, transferrin saturation 47%, LDL 145, HDL 33 Labs (10/12): K 3.5, creatinine 1.4, LDL 55, HDL 38, BNP 81 Labs (11/12): K 4, creatinine 1.4 Labs (3/15): K 3.9, creatinine 1.6 Labs (6/15): K 4.2, creatinine 1.45, LDL 38, HDL 20 Labs (1/17): K 4, creatinine 1.59 Labs (3/19): TGs 360, LDL 67 Labs (4/19): K 4.3, creatinine 1.73  PMH: 1. Childhood asthma 2. Hyperlipidemia 3. Systolic CHF: Echo (9/12) with EF 15%, moderate-severe LV dilation, inferior/posterior/inferoseptal/apical akinesis, septal/anterolateral severe hypokinesis, mild to moderate RV dilation with normal RV systolic function.  HIV negative, SPEP negative.  No history of ETOH or drugs.  No history of HTN.  LHC (9/12) with no angiographic CAD. Possible viral myocarditis versus familial cardiomyopathy.  Cardiac MRI (9/12) with EF 15%, moderate to severe LV dilation, mild RV dysfunction, no myocardial delayed enhancement.  Echo (1/13): EF 15%, diffuse HK, grade I diastolic dysfunction.  CPX (1/13): VO2 max 33.6, VE/VCO2 normal.  Echo (3/13): EF 25-30%, global hypokinesis, normal RV.  Echo (1/15): EF 25-30%, moderate LV dilation, global hypokinesis, prominent apical trabeculations, normal RV.  CPX (2/15): RER 1.26, peak VO2 26, VE/VCO2 27.  - [employer] Jude ICD - Echo (3/19) with EF 55-60%.   4. ACEI cough 5. CKD 6. H/o NSVT 7. CKD stage II-III  FH: Father with CHF in his 25s, thinks he had an MI.  Father has PCM.  Father had 9 brothers, all had MIs or CHF.  Sister with MI in her 32s.  SH: Lives in [place] with his wife.  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  No tobacco, no ETOH, no no drugs.   ROS: All systems reviewed and negative except as per HPI.   Current Outpatient Medications  Medication Sig Dispense Refill  . atorvastatin (LIPITOR) 20 MG tablet TAKE 1 TABLET BY MOUTH EVERY DAY 30 tablet 10  . carvedilol (COREG) 25 MG tablet TAKE ONE TABLET BY MOUTH TWICE DAILY MEALS.  KEEP APPOINTMENT FOR FURTURE REFILLS 60 tablet 10  . fenofibrate (TRICOR) 48 MG tablet Take 1 tablet (48 mg total) by mouth daily. 30 tablet 5  . isosorbide-hydrALAZINE (BIDIL) 20-37.5 MG tablet Take 1/2 Tablet Three times Daily 45 tablet 3  . sacubitril-valsartan (ENTRESTO) 24-26 MG Take 1 tablet by mouth 2 (two) times daily. 60 tablet 1  . spironolactone (ALDACTONE) 25 MG tablet TAKE 1 TABLET BY MOUTH EVERY DAY 90 tablet 3   No current facility-administered medications for this encounter.     BP (!) 96/59   Pulse 82   Wt 195 lb 12 oz (88.8 kg)   SpO2 97%   BMI 30.66 kg/m  General: NAD Neck: No JVD, no thyromegaly or thyroid nodule.  Lungs: Clear to auscultation bilaterally with normal respiratory effort. CV: Nondisplaced PMI.  Heart regular S1/S2, no S3/S4, no murmur.  No peripheral edema.  No carotid bruit.  Normal pedal pulses.  Abdomen: Soft, nontender, no hepatosplenomegaly, no distention.  Skin: Intact without lesions or rashes.  Neurologic: Alert and oriented x 3.  Psych: Normal affect. Extremities: No clubbing or cyanosis.  HEENT: Normal.   Assessment/Plan: 1. Chronic systolic CHF: Nonischemic cardiomyopathy.  ?Viral myocarditis versus familial cardiomyopathy.  Prominent apical trabeculations on last echo raise concern for noncompaction also.  EF was 25-30% echo in 1/15.  He has a Secondary school [employer] ICD. CPX in 2/15 showed overall normal functional capacity but he did not do as well as in 2013.  Echo done today and reviewed by me showed improvement in EF back to normal range, 55-60%.   - No Lasix needed at this time.  - Continue Coreg 25 bid, Entresto 24/26 bid, spironolactone 25 daily.  - With soft BP, decrease Bidil to 1 tab tid.   - With improvement in EF, think we can cancel CPX.  - BMET today and again in 3 months.  2. Hyperlipidemia: Continue atorvastatin, check lipids with BMET in 3 months.    Followup with me in 6 months.    Marca Ancona 07/16/2017

## 2017-07-18 ENCOUNTER — Other Ambulatory Visit (HOSPITAL_COMMUNITY): Payer: Self-pay

## 2017-07-18 DIAGNOSIS — I5022 Chronic systolic (congestive) heart failure: Secondary | ICD-10-CM

## 2017-08-05 DIAGNOSIS — I5022 Chronic systolic (congestive) heart failure: Secondary | ICD-10-CM | POA: Diagnosis not present

## 2017-08-06 ENCOUNTER — Other Ambulatory Visit (HOSPITAL_COMMUNITY): Payer: 59

## 2017-08-12 ENCOUNTER — Ambulatory Visit (INDEPENDENT_AMBULATORY_CARE_PROVIDER_SITE_OTHER): Payer: 59 | Admitting: *Deleted

## 2017-08-12 DIAGNOSIS — I5022 Chronic systolic (congestive) heart failure: Secondary | ICD-10-CM

## 2017-08-12 NOTE — Progress Notes (Signed)
Remote ICD transmission.   

## 2017-08-17 ENCOUNTER — Other Ambulatory Visit: Payer: Self-pay | Admitting: Pharmacist

## 2017-08-19 LAB — CUP PACEART REMOTE DEVICE CHECK
Battery Voltage: 2.96 V
Date Time Interrogation Session: 20190514060018
HIGH POWER IMPEDANCE MEASURED VALUE: 65 Ohm
HighPow Impedance: 65 Ohm
Implantable Lead Implant Date: 20150925
Implantable Lead Location: 753860
Implantable Pulse Generator Implant Date: 20150925
Lead Channel Impedance Value: 400 Ohm
Lead Channel Pacing Threshold Amplitude: 1 V
Lead Channel Sensing Intrinsic Amplitude: 7 mV
Lead Channel Setting Pacing Amplitude: 2.5 V
Lead Channel Setting Pacing Pulse Width: 0.5 ms
MDC IDC LEAD SERIAL: 330358
MDC IDC MSMT BATTERY REMAINING LONGEVITY: 72 mo
MDC IDC MSMT BATTERY REMAINING PERCENTAGE: 71 %
MDC IDC MSMT LEADCHNL RV PACING THRESHOLD PULSEWIDTH: 0.5 ms
MDC IDC SET LEADCHNL RV SENSING SENSITIVITY: 0.5 mV
MDC IDC STAT BRADY RV PERCENT PACED: 1 %
Pulse Gen Serial Number: 7228287

## 2017-10-14 ENCOUNTER — Other Ambulatory Visit (HOSPITAL_COMMUNITY): Payer: 59

## 2017-10-21 ENCOUNTER — Ambulatory Visit (HOSPITAL_COMMUNITY)
Admission: RE | Admit: 2017-10-21 | Discharge: 2017-10-21 | Disposition: A | Discharge status: Home / Self Care | Payer: 59 | Source: Ambulatory Visit | Attending: Cardiology | Admitting: Cardiology

## 2017-10-21 DIAGNOSIS — I5022 Chronic systolic (congestive) heart failure: Secondary | ICD-10-CM | POA: Diagnosis present

## 2017-10-21 DIAGNOSIS — E785 Hyperlipidemia, unspecified: Secondary | ICD-10-CM

## 2017-10-21 LAB — BASIC METABOLIC PANEL
ANION GAP: 7 (ref 5–15)
BUN: 20 mg/dL (ref 6–20)
CALCIUM: 9.6 mg/dL (ref 8.9–10.3)
CO2: 27 mmol/L (ref 22–32)
Chloride: 107 mmol/L (ref 98–111)
Creatinine, Ser: 1.78 mg/dL — ABNORMAL HIGH (ref 0.61–1.24)
GFR, EST AFRICAN AMERICAN: 47 mL/min — AB (ref >60)
GFR, EST NON AFRICAN AMERICAN: 41 mL/min — AB (ref >60)
GLUCOSE: 133 mg/dL — AB (ref 70–99)
Potassium: 3.9 mmol/L (ref 3.5–5.1)
Sodium: 141 mmol/L (ref 135–145)

## 2017-10-21 LAB — LIPID PANEL
Cholesterol: 133 mg/dL (ref 0–200)
HDL: 26 mg/dL — AB (ref >40)
LDL CALC: 75 mg/dL (ref 0–99)
TRIGLYCERIDES: 158 mg/dL — AB (ref <150)
Total CHOL/HDL Ratio: 5.1 RATIO
VLDL: 32 mg/dL (ref 0–40)

## 2017-11-11 ENCOUNTER — Ambulatory Visit (INDEPENDENT_AMBULATORY_CARE_PROVIDER_SITE_OTHER): Payer: 59 | Admitting: *Deleted

## 2017-11-11 DIAGNOSIS — I5022 Chronic systolic (congestive) heart failure: Secondary | ICD-10-CM | POA: Diagnosis not present

## 2017-11-11 DIAGNOSIS — I428 Other cardiomyopathies: Secondary | ICD-10-CM

## 2017-11-11 NOTE — Progress Notes (Signed)
Remote ICD transmission.   

## 2017-11-24 ENCOUNTER — Other Ambulatory Visit: Payer: Self-pay | Admitting: Internal Medicine

## 2017-11-25 ENCOUNTER — Other Ambulatory Visit (HOSPITAL_COMMUNITY): Payer: Self-pay

## 2017-11-25 MED ORDER — ISOSORB DINITRATE-HYDRALAZINE 20-37.5 MG PO TABS: ORAL_TABLET | ORAL | 3 refills | Status: DC

## 2017-12-07 LAB — CUP PACEART REMOTE DEVICE CHECK
Battery Remaining Percentage: 70 %
Brady Statistic RV Percent Paced: 1 %
HIGH POWER IMPEDANCE MEASURED VALUE: 71 Ohm
HighPow Impedance: 71 Ohm
Implantable Lead Implant Date: 20150925
Implantable Lead Location: 753860
Implantable Lead Model: 181
Lead Channel Impedance Value: 400 Ohm
Lead Channel Pacing Threshold Amplitude: 1 V
Lead Channel Pacing Threshold Pulse Width: 0.5 ms
Lead Channel Setting Pacing Pulse Width: 0.5 ms
Lead Channel Setting Sensing Sensitivity: 0.5 mV
MDC IDC LEAD SERIAL: 330358
MDC IDC MSMT BATTERY REMAINING LONGEVITY: 71 mo
MDC IDC MSMT BATTERY VOLTAGE: 2.95 V
MDC IDC MSMT LEADCHNL RV SENSING INTR AMPL: 8.6 mV
MDC IDC PG IMPLANT DT: 20150925
MDC IDC SESS DTM: 20190813060017
MDC IDC SET LEADCHNL RV PACING AMPLITUDE: 2.5 V
Pulse Gen Serial Number: 7228287

## 2017-12-29 ENCOUNTER — Other Ambulatory Visit: Payer: Self-pay | Admitting: Internal Medicine

## 2018-02-10 ENCOUNTER — Ambulatory Visit (INDEPENDENT_AMBULATORY_CARE_PROVIDER_SITE_OTHER): Payer: 59 | Admitting: *Deleted

## 2018-02-10 DIAGNOSIS — I428 Other cardiomyopathies: Secondary | ICD-10-CM

## 2018-02-10 DIAGNOSIS — I5022 Chronic systolic (congestive) heart failure: Secondary | ICD-10-CM

## 2018-02-10 NOTE — Progress Notes (Signed)
Remote ICD transmission.   

## 2018-03-05 ENCOUNTER — Other Ambulatory Visit (HOSPITAL_COMMUNITY): Payer: Self-pay

## 2018-03-05 MED ORDER — FENOFIBRATE 48 MG PO TABS: 48.0000 mg | ORAL_TABLET | Freq: Every day | ORAL | 5 refills | Status: DC

## 2018-04-14 LAB — CUP PACEART REMOTE DEVICE CHECK
Battery Remaining Percentage: 67 %
Battery Voltage: 2.95 V
Brady Statistic RV Percent Paced: 1 %
HighPow Impedance: 66 Ohm
HighPow Impedance: 66 Ohm
Implantable Lead Implant Date: 20150925
Implantable Lead Location: 753860
Implantable Lead Model: 181
Lead Channel Pacing Threshold Amplitude: 1 V
Lead Channel Pacing Threshold Pulse Width: 0.5 ms
Lead Channel Setting Pacing Pulse Width: 0.5 ms
Lead Channel Setting Sensing Sensitivity: 0.5 mV
MDC IDC LEAD SERIAL: 330358
MDC IDC MSMT BATTERY REMAINING LONGEVITY: 68 mo
MDC IDC MSMT LEADCHNL RV IMPEDANCE VALUE: 380 Ohm
MDC IDC MSMT LEADCHNL RV SENSING INTR AMPL: 7.2 mV
MDC IDC PG IMPLANT DT: 20150925
MDC IDC SESS DTM: 20191112070016
MDC IDC SET LEADCHNL RV PACING AMPLITUDE: 2.5 V
Pulse Gen Serial Number: 7228287

## 2018-04-19 ENCOUNTER — Other Ambulatory Visit (HOSPITAL_COMMUNITY): Payer: Self-pay | Admitting: Internal Medicine

## 2018-04-21 ENCOUNTER — Telehealth: Payer: Self-pay

## 2018-04-21 NOTE — Telephone Encounter (Addendum)
Letter received from [employer] stating that they have approved the pts Entresto PA. Approval good until 05/19/2019 [id number]  The pts pharmacy is aware of this approval.

## 2018-04-30 ENCOUNTER — Other Ambulatory Visit (HOSPITAL_COMMUNITY): Payer: Self-pay | Admitting: Internal Medicine

## 2018-05-11 ENCOUNTER — Other Ambulatory Visit (HOSPITAL_COMMUNITY): Payer: Self-pay | Admitting: Internal Medicine

## 2018-05-12 ENCOUNTER — Ambulatory Visit (INDEPENDENT_AMBULATORY_CARE_PROVIDER_SITE_OTHER): Payer: 59

## 2018-05-12 ENCOUNTER — Other Ambulatory Visit (HOSPITAL_COMMUNITY): Payer: Self-pay

## 2018-05-12 DIAGNOSIS — I428 Other cardiomyopathies: Secondary | ICD-10-CM | POA: Diagnosis not present

## 2018-05-12 DIAGNOSIS — I5022 Chronic systolic (congestive) heart failure: Secondary | ICD-10-CM

## 2018-05-12 LAB — CUP PACEART REMOTE DEVICE CHECK
Battery Remaining Longevity: 67 mo
Battery Remaining Percentage: 66 %
Battery Voltage: 2.95 V
Brady Statistic RV Percent Paced: 1 %
HighPow Impedance: 73 Ohm
HighPow Impedance: 73 Ohm
Implantable Lead Implant Date: 20150925
Implantable Lead Model: 181
Implantable Lead Serial Number: 330358
Implantable Pulse Generator Implant Date: 20150925
Lead Channel Impedance Value: 400 Ohm
Lead Channel Pacing Threshold Amplitude: 1 V
Lead Channel Sensing Intrinsic Amplitude: 6.6 mV
MDC IDC LEAD LOCATION: 753860
MDC IDC MSMT LEADCHNL RV PACING THRESHOLD PULSEWIDTH: 0.5 ms
MDC IDC PG SERIAL: 7228287
MDC IDC SESS DTM: 20200211100019
MDC IDC SET LEADCHNL RV PACING AMPLITUDE: 2.5 V
MDC IDC SET LEADCHNL RV PACING PULSEWIDTH: 0.5 ms
MDC IDC SET LEADCHNL RV SENSING SENSITIVITY: 0.5 mV

## 2018-05-12 MED ORDER — ISOSORB DINITRATE-HYDRALAZINE 20-37.5 MG PO TABS: ORAL_TABLET | ORAL | 0 refills | Status: DC

## 2018-05-22 NOTE — Progress Notes (Signed)
Remote ICD transmission.   

## 2018-06-09 ENCOUNTER — Other Ambulatory Visit (HOSPITAL_COMMUNITY): Payer: Self-pay | Admitting: Cardiology

## 2018-06-09 ENCOUNTER — Other Ambulatory Visit (HOSPITAL_COMMUNITY): Payer: Self-pay | Admitting: Internal Medicine

## 2018-06-23 ENCOUNTER — Other Ambulatory Visit (HOSPITAL_COMMUNITY): Payer: Self-pay | Admitting: Internal Medicine

## 2018-06-28 ENCOUNTER — Other Ambulatory Visit (HOSPITAL_COMMUNITY): Payer: Self-pay | Admitting: Internal Medicine

## 2018-07-08 ENCOUNTER — Other Ambulatory Visit (HOSPITAL_COMMUNITY): Payer: Self-pay | Admitting: Cardiology

## 2018-07-24 ENCOUNTER — Other Ambulatory Visit (HOSPITAL_COMMUNITY): Payer: Self-pay | Admitting: Cardiology

## 2018-07-29 ENCOUNTER — Other Ambulatory Visit: Payer: Self-pay | Admitting: Internal Medicine

## 2018-08-11 ENCOUNTER — Other Ambulatory Visit: Payer: Self-pay

## 2018-08-11 ENCOUNTER — Ambulatory Visit (INDEPENDENT_AMBULATORY_CARE_PROVIDER_SITE_OTHER): Payer: 59 | Admitting: *Deleted

## 2018-08-11 DIAGNOSIS — I5022 Chronic systolic (congestive) heart failure: Secondary | ICD-10-CM

## 2018-08-11 DIAGNOSIS — I428 Other cardiomyopathies: Secondary | ICD-10-CM | POA: Diagnosis not present

## 2018-08-12 LAB — CUP PACEART REMOTE DEVICE CHECK
Date Time Interrogation Session: 20200513084709
Implantable Lead Implant Date: 20150925
Implantable Lead Location: 753860
Implantable Lead Model: 181
Implantable Lead Serial Number: 330358
Implantable Pulse Generator Implant Date: 20150925
Pulse Gen Serial Number: 7228287

## 2018-08-26 NOTE — Progress Notes (Signed)
Remote ICD transmission.   

## 2018-08-29 ENCOUNTER — Other Ambulatory Visit (HOSPITAL_COMMUNITY): Payer: Self-pay | Admitting: Internal Medicine

## 2018-08-29 ENCOUNTER — Other Ambulatory Visit (HOSPITAL_COMMUNITY): Payer: Self-pay | Admitting: Cardiology

## 2018-09-01 ENCOUNTER — Other Ambulatory Visit (HOSPITAL_COMMUNITY): Payer: Self-pay

## 2018-09-01 MED ORDER — ISOSORB DINITRATE-HYDRALAZINE 20-37.5 MG PO TABS: ORAL_TABLET | ORAL | 0 refills | Status: DC

## 2018-09-08 ENCOUNTER — Other Ambulatory Visit (HOSPITAL_COMMUNITY): Payer: Self-pay | Admitting: Cardiology

## 2018-09-11 ENCOUNTER — Other Ambulatory Visit: Payer: Self-pay | Admitting: Internal Medicine

## 2018-09-19 ENCOUNTER — Other Ambulatory Visit (HOSPITAL_COMMUNITY): Payer: Self-pay | Admitting: Internal Medicine

## 2018-09-28 ENCOUNTER — Other Ambulatory Visit (HOSPITAL_COMMUNITY): Payer: Self-pay | Admitting: Cardiology

## 2018-10-24 ENCOUNTER — Other Ambulatory Visit (HOSPITAL_COMMUNITY): Payer: Self-pay | Admitting: Cardiology

## 2018-10-27 ENCOUNTER — Other Ambulatory Visit (HOSPITAL_COMMUNITY): Payer: Self-pay | Admitting: Cardiology

## 2018-11-10 ENCOUNTER — Ambulatory Visit (INDEPENDENT_AMBULATORY_CARE_PROVIDER_SITE_OTHER): Payer: 59 | Admitting: *Deleted

## 2018-11-10 DIAGNOSIS — I428 Other cardiomyopathies: Secondary | ICD-10-CM

## 2018-11-10 DIAGNOSIS — I5022 Chronic systolic (congestive) heart failure: Secondary | ICD-10-CM

## 2018-11-10 LAB — CUP PACEART REMOTE DEVICE CHECK
Battery Remaining Longevity: 62 mo
Battery Remaining Percentage: 62 %
Battery Voltage: 2.93 V
Brady Statistic RV Percent Paced: 1 %
Date Time Interrogation Session: 20200811060016
HighPow Impedance: 60 Ohm
HighPow Impedance: 60 Ohm
Implantable Lead Implant Date: 20150925
Implantable Lead Location: 753860
Implantable Lead Model: 181
Implantable Lead Serial Number: 330358
Implantable Pulse Generator Implant Date: 20150925
Lead Channel Impedance Value: 360 Ohm
Lead Channel Pacing Threshold Amplitude: 1 V
Lead Channel Pacing Threshold Pulse Width: 0.5 ms
Lead Channel Sensing Intrinsic Amplitude: 7.3 mV
Lead Channel Setting Pacing Amplitude: 2.5 V
Lead Channel Setting Pacing Pulse Width: 0.5 ms
Lead Channel Setting Sensing Sensitivity: 0.5 mV
Pulse Gen Serial Number: 7228287

## 2018-11-11 ENCOUNTER — Other Ambulatory Visit: Payer: Self-pay | Admitting: Internal Medicine

## 2018-11-11 ENCOUNTER — Other Ambulatory Visit (HOSPITAL_COMMUNITY): Payer: Self-pay | Admitting: Cardiology

## 2018-11-17 ENCOUNTER — Other Ambulatory Visit (HOSPITAL_COMMUNITY): Payer: Self-pay | Admitting: Cardiology

## 2018-11-19 NOTE — Progress Notes (Signed)
Remote ICD transmission.   

## 2018-12-19 ENCOUNTER — Other Ambulatory Visit: Payer: Self-pay | Admitting: Internal Medicine

## 2018-12-19 ENCOUNTER — Other Ambulatory Visit (HOSPITAL_COMMUNITY): Payer: Self-pay | Admitting: Cardiology

## 2018-12-23 ENCOUNTER — Other Ambulatory Visit (HOSPITAL_COMMUNITY): Payer: Self-pay | Admitting: Cardiology

## 2018-12-30 ENCOUNTER — Other Ambulatory Visit (HOSPITAL_COMMUNITY): Payer: Self-pay | Admitting: Internal Medicine

## 2019-01-15 ENCOUNTER — Encounter (HOSPITAL_COMMUNITY): Payer: Self-pay | Admitting: Cardiology

## 2019-01-15 ENCOUNTER — Ambulatory Visit (HOSPITAL_COMMUNITY)
Admission: RE | Admit: 2019-01-15 | Discharge: 2019-01-15 | Disposition: A | Discharge status: Home / Self Care | Payer: 59 | Source: Ambulatory Visit | Attending: Cardiology | Admitting: Cardiology

## 2019-01-15 ENCOUNTER — Other Ambulatory Visit: Payer: Self-pay

## 2019-01-15 VITALS — BP 141/87 | HR 71 | Wt 203.2 lb

## 2019-01-15 DIAGNOSIS — I130 Hypertensive heart and chronic kidney disease with heart failure and stage 1 through stage 4 chronic kidney disease, or unspecified chronic kidney disease: Secondary | ICD-10-CM | POA: Insufficient documentation

## 2019-01-15 DIAGNOSIS — I428 Other cardiomyopathies: Secondary | ICD-10-CM | POA: Diagnosis not present

## 2019-01-15 DIAGNOSIS — Z8249 Family history of ischemic heart disease and other diseases of the circulatory system: Secondary | ICD-10-CM | POA: Insufficient documentation

## 2019-01-15 DIAGNOSIS — I5022 Chronic systolic (congestive) heart failure: Secondary | ICD-10-CM | POA: Insufficient documentation

## 2019-01-15 DIAGNOSIS — Z79899 Other long term (current) drug therapy: Secondary | ICD-10-CM | POA: Diagnosis not present

## 2019-01-15 DIAGNOSIS — N183 Chronic kidney disease, stage 3 unspecified: Secondary | ICD-10-CM | POA: Diagnosis not present

## 2019-01-15 DIAGNOSIS — E785 Hyperlipidemia, unspecified: Secondary | ICD-10-CM | POA: Insufficient documentation

## 2019-01-15 DIAGNOSIS — Z95810 Presence of automatic (implantable) cardiac defibrillator: Secondary | ICD-10-CM | POA: Diagnosis not present

## 2019-01-15 LAB — LIPID PANEL
Cholesterol: 182 mg/dL (ref 0–200)
HDL: 22 mg/dL — ABNORMAL LOW (ref >40)
LDL Cholesterol: 107 mg/dL — ABNORMAL HIGH (ref 0–99)
Total CHOL/HDL Ratio: 8.3 RATIO
Triglycerides: 264 mg/dL — ABNORMAL HIGH (ref <150)
VLDL: 53 mg/dL — ABNORMAL HIGH (ref 0–40)

## 2019-01-15 LAB — BASIC METABOLIC PANEL
Anion gap: 8 (ref 5–15)
BUN: 22 mg/dL — ABNORMAL HIGH (ref 6–20)
CO2: 25 mmol/L (ref 22–32)
Calcium: 9.4 mg/dL (ref 8.9–10.3)
Chloride: 108 mmol/L (ref 98–111)
Creatinine, Ser: 2.2 mg/dL — ABNORMAL HIGH (ref 0.61–1.24)
GFR calc Af Amer: 37 mL/min — ABNORMAL LOW (ref >60)
GFR calc non Af Amer: 32 mL/min — ABNORMAL LOW (ref >60)
Glucose, Bld: 96 mg/dL (ref 70–99)
Potassium: 4.7 mmol/L (ref 3.5–5.1)
Sodium: 141 mmol/L (ref 135–145)

## 2019-01-15 LAB — CBC
HCT: 41.6 % (ref 39.0–52.0)
Hemoglobin: 12.8 g/dL — ABNORMAL LOW (ref 13.0–17.0)
MCH: 28.8 pg (ref 26.0–34.0)
MCHC: 30.8 g/dL (ref 30.0–36.0)
MCV: 93.7 fL (ref 80.0–100.0)
Platelets: 153 10*3/uL (ref 150–400)
RBC: 4.44 MIL/uL (ref 4.22–5.81)
RDW: 12.6 % (ref 11.5–15.5)
WBC: 5.5 10*3/uL (ref 4.0–10.5)
nRBC: 0 % (ref 0.0–0.2)

## 2019-01-15 MED ORDER — BIDIL 20-37.5 MG PO TABS: 1.0000 | ORAL_TABLET | Freq: Three times a day (TID) | ORAL | 5 refills | Status: DC

## 2019-01-15 NOTE — Patient Instructions (Signed)
INCREASE Bidil to 1 tab three times a day  Labs today and repeat in 3 months We will only contact you if something comes back abnormal or we need to make some changes. Otherwise no news is good news!  Your physician has requested that you have an echocardiogram. Echocardiography is a painless test that uses sound waves to create images of your heart. It provides your doctor with information about the size and shape of your heart and how well your heart's chambers and valves are working. This procedure takes approximately one hour. There are no restrictions for this procedure.  Please purchase a blood pressure cuff and check your blood pressure daily  FIND a primary phsycian to over see your health.   Your physician recommends that you schedule a follow-up appointment in: 6 months with Dr Aundra Dubin. You will get a call to schedule this appointment.   At the [facility], you and your health needs are our priority. As part of our continuing mission to provide you with exceptional heart care, we have created designated Provider Care Teams. These Care Teams include your primary Cardiologist (physician) and Advanced Practice Providers (APPs- Physician Assistants and Nurse Practitioners) who all work together to provide you with the care you need, when you need it.   You may see any of the following providers on your designated Care Team at your next follow up: Marland Kitchen Dr Glori Bickers . Dr Loralie Champagne . Darrick Grinder, NP . Lyda Jester, PA   Please be sure to bring in all your medications bottles to every appointment.

## 2019-01-17 NOTE — Progress Notes (Signed)
Patient ID: Edward Greene, male   DOB: [birth year], 58 y.o.   MRN: [id number]  Cardiology: Dr. Aundra Dubin  58 y.o. with minimal past history presented initially after hospitalization with acute systolic CHF.  In 9/12, patient began to notice dyspnea with heavy exertion, such as when he carried something up a flight of steps. He also was having difficulty sleeping with symptoms that sounded consistent with orthopnea.  No chest pain.  He went to urgent care for evaluation, and CXR showed pulmonary edema.  BNP was elevated and ECG was abnormal.  He was sent to the ER.  Evaluation showed CHF with volume overload.  Echo showed EF 15% with moderate to severe LV dilation and multiple wall motion abnormalities.  Cardiac enzymes were negative.  Patient was diuresed and started on cardiac meds.  Left heart cath in 9/12 showed no angiographic CAD.  Cardiac MRI (9/12) showed EF 15%, moderate to severe LV dilation, mild RV dysfunction, and no delayed enhancement.  Cardiopulmonary exercise test, however, showed no significant functional limitation with VO2 max 33.6 and normal VE/VCO2 slope.  Echo 3/13 showed EF 25-30%.  Last echo in 1/15 showed EF remained 25-30% with moderate LV dilation and global hypokinesis.  There were prominent apical trabeculations concerning for possilbe noncompaction, RV was normal.  CPX was repeated in 2/15.  Peak VO2 was good at 26 but lower than prior.  VE/VCO2 slope 27 with RER 1.26.  He was seen by Dr. Caryl Comes and had [facility] ICD placed.    Echo in 3/19 showed increase in EF to 55-60%, significant improvement.    He returns for followup of CHF.  Main complaint is fatigue, still working two jobs and not getting a lot of sleep.  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  Does well with this.  Gets mildly short of breath running.  No chest pain.  No orthopnea/PND.  BP mildly elevated today. Weight has been stable.   Labs (9/12): K 4.3, creatinine 1.7 => 1.6, pro-BNP  907=>218, SPEP negative, HIV negative, transferrin saturation 47%, LDL 145, HDL 33 Labs (10/12): K 3.5, creatinine 1.4, LDL 55, HDL 38, BNP 81 Labs (11/12): K 4, creatinine 1.4 Labs (3/15): K 3.9, creatinine 1.6 Labs (6/15): K 4.2, creatinine 1.45, LDL 38, HDL 20 Labs (1/17): K 4, creatinine 1.59 Labs (3/19): TGs 360, LDL 67 Labs (4/19): K 4.3, creatinine 1.73 Labs (7/19): K 3.9, creatinine 1.78, LDL 75  PMH: 1. Childhood asthma 2. Hyperlipidemia 3. Systolic CHF: Echo (2/77) with EF 15%, moderate-severe LV dilation, inferior/posterior/inferoseptal/apical akinesis, septal/anterolateral severe hypokinesis, mild to moderate RV dilation with normal RV systolic function.  HIV negative, SPEP negative.  No history of ETOH or drugs.  No history of HTN.  LHC (9/12) with no angiographic CAD. Possible viral myocarditis versus familial cardiomyopathy.  Cardiac MRI (9/12) with EF 15%, moderate to severe LV dilation, mild RV dysfunction, no myocardial delayed enhancement.  Echo (1/13): EF 15%, diffuse HK, grade I diastolic dysfunction.  CPX (1/13): VO2 max 33.6, VE/VCO2 normal.  Echo (3/13): EF 25-30%, global hypokinesis, normal RV.  Echo (1/15): EF 25-30%, moderate LV dilation, global hypokinesis, prominent apical trabeculations, normal RV.  CPX (2/15): RER 1.26, peak VO2 26, VE/VCO2 27.  - [employer] Jude ICD - Echo (3/19) with EF 55-60%.   4. ACEI cough 5. CKD 6. H/o NSVT 7. CKD stage III  FH: Father with CHF in his 12s, thinks he had an MI.  Father has PCM.  Father had 9 brothers, all had MIs or CHF.  Sister with MI in her 1s.   SH: Lives in [place] with his wife.  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  No tobacco, no ETOH, no no drugs.   ROS: All systems reviewed and negative except as per HPI.   Current Outpatient Medications  Medication Sig Dispense Refill  . atorvastatin (LIPITOR) 20 MG tablet TAKE 1 TABLET BY MOUTH EVERY DAY; SCHEDULE APPT FOR FUTURE REFILLS 30  tablet 0  . carvedilol (COREG) 25 MG tablet TAKE 1 TABLET BY MOUTH TWICE DAILY WITH A MEAL. PLEASE MAKE ANNUAL APPT FOR FUTURE REFILLS 60 tablet 3  . fenofibrate (TRICOR) 48 MG tablet TAKE 1 TABLET(48 MG) BY MOUTH DAILY 30 tablet 5  . isosorbide-hydrALAZINE (BIDIL) 20-37.5 MG tablet Take 1 tablet by mouth 3 (three) times daily. TAKE 1/2 TABLET BY MOUTH THREE TIMES DAILY 90 tablet 5  . sacubitril-valsartan (ENTRESTO) 24-26 MG Take 1 tablet by mouth 2 (two) times daily. Please make overdue appt with Dr. Graciela Husbands before anymore refills. 3rd and Final Attempt 30 tablet 0  . spironolactone (ALDACTONE) 25 MG tablet TAKE 1 TABLET BY MOUTH EVERY DAY 90 tablet 1   No current facility-administered medications for this encounter.     BP (!) 141/87   Pulse 71   Wt 92.2 kg (203 lb 3.2 oz)   SpO2 96%   BMI 31.83 kg/m  General: NAD Neck: No JVD, no thyromegaly or thyroid nodule.  Lungs: Clear to auscultation bilaterally with normal respiratory effort. CV: Nondisplaced PMI.  Heart regular S1/S2, no S3/S4, no murmur.  No peripheral edema.  No carotid bruit.  Normal pedal pulses.  Abdomen: Soft, nontender, no hepatosplenomegaly, no distention.  Skin: Intact without lesions or rashes.  Neurologic: Alert and oriented x 3.  Psych: Normal affect. Extremities: No clubbing or cyanosis.  HEENT: Normal.   Assessment/Plan: 1. Chronic systolic CHF: Nonischemic cardiomyopathy.  ?Viral myocarditis versus familial cardiomyopathy.  Prominent apical trabeculations on last echo raise concern for noncompaction also.  EF was 25-30% echo in 1/15.  He has a Secondary school teacher ICD. CPX in 2/15 showed overall normal functional capacity but he did not do as well as in 2013.  Echo in 3/19 showed improvement in EF back to normal range, 55-60%.   - No Lasix needed at this time.  - Continue Coreg 25 bid, Entresto 24/26 bid, spironolactone 25 daily, Bidil 1/2 tab tid.  - BMET today and every 3 months.  - I will arrange for repeat echo to make  sure that EF remains normal.   2. Hyperlipidemia: Continue atorvastatin, check lipids.  3. HTN: BP mildly elevated today.   - I asked him to get a BP cuff at home and follow his numbers for a couple weeks, calling to me the results.  4. CKD: Stage 3.  Needs to keep BP controlled and needs to avoid NSAIDs.   - BMET today, if creatinine is worse will need nephrology referral.     Followup with me in 6 months.    Marca Ancona 01/17/2019

## 2019-01-18 ENCOUNTER — Telehealth (HOSPITAL_COMMUNITY): Payer: Self-pay | Admitting: *Deleted

## 2019-01-18 MED ORDER — BIDIL 20-37.5 MG PO TABS: 0.5000 | ORAL_TABLET | Freq: Three times a day (TID) | ORAL | 5 refills | Status: DC

## 2019-01-18 NOTE — Telephone Encounter (Signed)
Received VM from pharmacy stating they need clarification for pt's Bidil RX.  The rx they received for it last week had 2 different directions: take 1 tab TID and take 1/2 tab TID, they need to know which one is correct.  Per OV note pt is to be on 1/2 tab TID.  RX sent in to them for clarification.

## 2019-01-19 ENCOUNTER — Other Ambulatory Visit (HOSPITAL_COMMUNITY): Payer: Self-pay

## 2019-01-19 MED ORDER — BIDIL 20-37.5 MG PO TABS: 0.5000 | ORAL_TABLET | Freq: Three times a day (TID) | ORAL | 5 refills | Status: DC

## 2019-01-20 ENCOUNTER — Other Ambulatory Visit: Payer: Self-pay

## 2019-01-20 ENCOUNTER — Ambulatory Visit (HOSPITAL_COMMUNITY)
Admission: RE | Admit: 2019-01-20 | Discharge: 2019-01-20 | Disposition: A | Discharge status: Home / Self Care | Payer: 59 | Source: Ambulatory Visit | Attending: Cardiology | Admitting: Cardiology

## 2019-01-20 DIAGNOSIS — I110 Hypertensive heart disease with heart failure: Secondary | ICD-10-CM | POA: Insufficient documentation

## 2019-01-20 DIAGNOSIS — I5022 Chronic systolic (congestive) heart failure: Secondary | ICD-10-CM | POA: Diagnosis not present

## 2019-01-20 DIAGNOSIS — E785 Hyperlipidemia, unspecified: Secondary | ICD-10-CM | POA: Diagnosis not present

## 2019-01-20 DIAGNOSIS — I429 Cardiomyopathy, unspecified: Secondary | ICD-10-CM | POA: Insufficient documentation

## 2019-01-20 NOTE — Progress Notes (Signed)
  Echocardiogram 2D Echocardiogram has been performed.  Edward Greene 01/20/2019, 3:50 PM

## 2019-01-25 ENCOUNTER — Telehealth (HOSPITAL_COMMUNITY): Payer: Self-pay

## 2019-01-25 ENCOUNTER — Encounter (HOSPITAL_COMMUNITY): Payer: Self-pay

## 2019-01-25 NOTE — Telephone Encounter (Signed)
Attempts to reach patient unsuccessful x3.  LM for patient to return call. As it has been 3 attempts, letter mailed.

## 2019-01-25 NOTE — Telephone Encounter (Signed)
error 

## 2019-01-25 NOTE — Telephone Encounter (Signed)
-----   Message from Larey Dresser, MD sent at 01/21/2019 10:54 PM EDT ----- EF is lower than on last echo, 40-45%, but not as low as it had been in the past.  Would have him increase Bidil from 1/2 tab tid to 1 tab tid.

## 2019-01-25 NOTE — Telephone Encounter (Signed)
-----   Message from Larey Dresser, MD sent at 01/17/2019  8:46 PM EDT ----- High triglycerides, needs to work on his diet (decreased fats). Increase fenofibrate to 145 mg daily.  Lipids 3 months.  Creatinine is also higher, suspect this is chronic.  Increase hydration, make sure no NSAIDs used.  Would refer him to nephrology.

## 2019-02-01 ENCOUNTER — Other Ambulatory Visit: Payer: Self-pay | Admitting: Internal Medicine

## 2019-02-01 MED ORDER — SPIRONOLACTONE 25 MG PO TABS: 25.0000 mg | ORAL_TABLET | Freq: Every day | ORAL | 0 refills | Status: DC

## 2019-02-01 MED ORDER — ENTRESTO 24-26 MG PO TABS: 1.0000 | ORAL_TABLET | Freq: Two times a day (BID) | ORAL | 0 refills | Status: DC

## 2019-02-10 ENCOUNTER — Ambulatory Visit (INDEPENDENT_AMBULATORY_CARE_PROVIDER_SITE_OTHER): Payer: 59 | Admitting: *Deleted

## 2019-02-10 DIAGNOSIS — I428 Other cardiomyopathies: Secondary | ICD-10-CM | POA: Diagnosis not present

## 2019-02-10 DIAGNOSIS — I5022 Chronic systolic (congestive) heart failure: Secondary | ICD-10-CM

## 2019-02-10 LAB — CUP PACEART REMOTE DEVICE CHECK
Battery Remaining Longevity: 60 mo
Battery Remaining Percentage: 60 %
Battery Voltage: 2.93 V
Brady Statistic RV Percent Paced: 1 %
Date Time Interrogation Session: 20201111033442
HighPow Impedance: 77 Ohm
HighPow Impedance: 77 Ohm
Implantable Lead Implant Date: 20150925
Implantable Lead Location: 753860
Implantable Lead Model: 181
Implantable Lead Serial Number: 330358
Implantable Pulse Generator Implant Date: 20150925
Lead Channel Impedance Value: 380 Ohm
Lead Channel Pacing Threshold Amplitude: 1 V
Lead Channel Pacing Threshold Pulse Width: 0.5 ms
Lead Channel Sensing Intrinsic Amplitude: 6.6 mV
Lead Channel Setting Pacing Amplitude: 2.5 V
Lead Channel Setting Pacing Pulse Width: 0.5 ms
Lead Channel Setting Sensing Sensitivity: 0.5 mV
Pulse Gen Serial Number: 7228287

## 2019-02-24 ENCOUNTER — Other Ambulatory Visit (HOSPITAL_COMMUNITY): Payer: Self-pay

## 2019-02-24 MED ORDER — ATORVASTATIN CALCIUM 20 MG PO TABS: ORAL_TABLET | ORAL | 0 refills | Status: DC

## 2019-03-03 NOTE — Progress Notes (Signed)
Remote ICD transmission.   

## 2019-03-10 ENCOUNTER — Other Ambulatory Visit: Payer: Self-pay | Admitting: Cardiology

## 2019-03-10 NOTE — Telephone Encounter (Signed)
This is a CHF pt. Dr. Aundra Dubin is the provider. Please address

## 2019-03-30 ENCOUNTER — Other Ambulatory Visit (HOSPITAL_COMMUNITY): Payer: Self-pay

## 2019-03-30 MED ORDER — ATORVASTATIN CALCIUM 20 MG PO TABS: ORAL_TABLET | ORAL | 3 refills | Status: DC

## 2019-04-19 ENCOUNTER — Other Ambulatory Visit: Payer: Self-pay

## 2019-04-19 ENCOUNTER — Other Ambulatory Visit (HOSPITAL_COMMUNITY): Payer: Self-pay

## 2019-04-19 ENCOUNTER — Ambulatory Visit (HOSPITAL_COMMUNITY)
Admission: RE | Admit: 2019-04-19 | Discharge: 2019-04-19 | Disposition: A | Discharge status: Home / Self Care | Payer: 59 | Source: Ambulatory Visit | Attending: Internal Medicine | Admitting: Internal Medicine

## 2019-04-19 DIAGNOSIS — I5022 Chronic systolic (congestive) heart failure: Secondary | ICD-10-CM | POA: Diagnosis present

## 2019-04-19 LAB — BASIC METABOLIC PANEL
Anion gap: 10 (ref 5–15)
BUN: 13 mg/dL (ref 6–20)
CO2: 25 mmol/L (ref 22–32)
Calcium: 9.3 mg/dL (ref 8.9–10.3)
Chloride: 105 mmol/L (ref 98–111)
Creatinine, Ser: 1.88 mg/dL — ABNORMAL HIGH (ref 0.61–1.24)
GFR calc Af Amer: 44 mL/min — ABNORMAL LOW (ref >60)
GFR calc non Af Amer: 38 mL/min — ABNORMAL LOW (ref >60)
Glucose, Bld: 116 mg/dL — ABNORMAL HIGH (ref 70–99)
Potassium: 3.8 mmol/L (ref 3.5–5.1)
Sodium: 140 mmol/L (ref 135–145)

## 2019-04-19 MED ORDER — SPIRONOLACTONE 25 MG PO TABS: 25.0000 mg | ORAL_TABLET | Freq: Every day | ORAL | 0 refills | Status: DC

## 2019-04-19 MED ORDER — CARVEDILOL 25 MG PO TABS: ORAL_TABLET | ORAL | 3 refills | Status: DC

## 2019-04-19 MED ORDER — ENTRESTO 24-26 MG PO TABS: 1.0000 | ORAL_TABLET | Freq: Two times a day (BID) | ORAL | 0 refills | Status: DC

## 2019-04-26 ENCOUNTER — Other Ambulatory Visit (HOSPITAL_COMMUNITY): Payer: Self-pay

## 2019-04-26 MED ORDER — FENOFIBRATE 48 MG PO TABS: ORAL_TABLET | ORAL | 5 refills | Status: DC

## 2019-05-11 LAB — CUP PACEART REMOTE DEVICE CHECK
Battery Remaining Longevity: 59 mo
Battery Remaining Percentage: 58 %
Battery Voltage: 2.93 V
Brady Statistic RV Percent Paced: 1 %
Date Time Interrogation Session: 20210209030747
HighPow Impedance: 68 Ohm
HighPow Impedance: 68 Ohm
Implantable Lead Implant Date: 20150925
Implantable Lead Location: 753860
Implantable Lead Model: 181
Implantable Lead Serial Number: 330358
Implantable Pulse Generator Implant Date: 20150925
Lead Channel Impedance Value: 350 Ohm
Lead Channel Pacing Threshold Amplitude: 1 V
Lead Channel Pacing Threshold Pulse Width: 0.5 ms
Lead Channel Sensing Intrinsic Amplitude: 7.6 mV
Lead Channel Setting Pacing Amplitude: 2.5 V
Lead Channel Setting Pacing Pulse Width: 0.5 ms
Lead Channel Setting Sensing Sensitivity: 0.5 mV
Pulse Gen Serial Number: 7228287

## 2019-05-12 ENCOUNTER — Ambulatory Visit (INDEPENDENT_AMBULATORY_CARE_PROVIDER_SITE_OTHER): Payer: 59 | Admitting: *Deleted

## 2019-05-12 DIAGNOSIS — I428 Other cardiomyopathies: Secondary | ICD-10-CM | POA: Diagnosis not present

## 2019-05-12 NOTE — Progress Notes (Signed)
ICD Remote  

## 2019-06-02 ENCOUNTER — Other Ambulatory Visit (HOSPITAL_COMMUNITY): Payer: Self-pay

## 2019-06-02 MED ORDER — ENTRESTO 24-26 MG PO TABS: 1.0000 | ORAL_TABLET | Freq: Two times a day (BID) | ORAL | 5 refills | Status: DC

## 2019-06-02 MED ORDER — SPIRONOLACTONE 25 MG PO TABS: 25.0000 mg | ORAL_TABLET | Freq: Every day | ORAL | 5 refills | Status: DC

## 2019-06-02 NOTE — Addendum Note (Signed)
Addended by: Marisa Hua on: 06/02/2019 04:12 PM   Modules accepted: Orders

## 2019-07-22 ENCOUNTER — Other Ambulatory Visit (HOSPITAL_COMMUNITY): Payer: Self-pay

## 2019-07-22 MED ORDER — CARVEDILOL 25 MG PO TABS: ORAL_TABLET | ORAL | 3 refills | Status: DC

## 2019-08-11 ENCOUNTER — Ambulatory Visit (INDEPENDENT_AMBULATORY_CARE_PROVIDER_SITE_OTHER): Payer: 59 | Admitting: *Deleted

## 2019-08-11 DIAGNOSIS — I5022 Chronic systolic (congestive) heart failure: Secondary | ICD-10-CM

## 2019-08-11 DIAGNOSIS — I428 Other cardiomyopathies: Secondary | ICD-10-CM

## 2019-08-11 LAB — CUP PACEART REMOTE DEVICE CHECK
Battery Remaining Longevity: 56 mo
Battery Remaining Percentage: 56 %
Battery Voltage: 2.92 V
Brady Statistic RV Percent Paced: 1 %
Date Time Interrogation Session: 20210511020018
HighPow Impedance: 71 Ohm
HighPow Impedance: 71 Ohm
Implantable Lead Implant Date: 20150925
Implantable Lead Location: 753860
Implantable Lead Model: 181
Implantable Lead Serial Number: 330358
Implantable Pulse Generator Implant Date: 20150925
Lead Channel Impedance Value: 360 Ohm
Lead Channel Pacing Threshold Amplitude: 1 V
Lead Channel Pacing Threshold Pulse Width: 0.5 ms
Lead Channel Sensing Intrinsic Amplitude: 5.4 mV
Lead Channel Setting Pacing Amplitude: 2.5 V
Lead Channel Setting Pacing Pulse Width: 0.5 ms
Lead Channel Setting Sensing Sensitivity: 0.5 mV
Pulse Gen Serial Number: 7228287

## 2019-08-13 NOTE — Progress Notes (Signed)
Remote ICD transmission.   

## 2019-09-20 ENCOUNTER — Other Ambulatory Visit (HOSPITAL_COMMUNITY): Payer: Self-pay | Admitting: *Deleted

## 2019-09-20 ENCOUNTER — Other Ambulatory Visit: Payer: Self-pay | Admitting: Internal Medicine

## 2019-09-20 DIAGNOSIS — I428 Other cardiomyopathies: Secondary | ICD-10-CM

## 2019-09-20 DIAGNOSIS — I5022 Chronic systolic (congestive) heart failure: Secondary | ICD-10-CM

## 2019-09-20 MED ORDER — BIDIL 20-37.5 MG PO TABS: 0.5000 | ORAL_TABLET | Freq: Three times a day (TID) | ORAL | 5 refills | Status: DC

## 2019-09-22 NOTE — Telephone Encounter (Signed)
This is a CHF pt, Dr. Shirlee Latch pt.

## 2019-11-10 ENCOUNTER — Ambulatory Visit (INDEPENDENT_AMBULATORY_CARE_PROVIDER_SITE_OTHER): Payer: 59 | Admitting: *Deleted

## 2019-11-10 DIAGNOSIS — I428 Other cardiomyopathies: Secondary | ICD-10-CM | POA: Diagnosis not present

## 2019-11-14 LAB — CUP PACEART REMOTE DEVICE CHECK
Battery Remaining Longevity: 55 mo
Battery Remaining Percentage: 54 %
Battery Voltage: 2.92 V
Brady Statistic RV Percent Paced: 1 %
Date Time Interrogation Session: 20210813151206
HighPow Impedance: 63 Ohm
HighPow Impedance: 63 Ohm
Implantable Lead Implant Date: 20150925
Implantable Lead Location: 753860
Implantable Lead Model: 181
Implantable Lead Serial Number: 330358
Implantable Pulse Generator Implant Date: 20150925
Lead Channel Impedance Value: 350 Ohm
Lead Channel Pacing Threshold Amplitude: 1 V
Lead Channel Pacing Threshold Pulse Width: 0.5 ms
Lead Channel Sensing Intrinsic Amplitude: 6.6 mV
Lead Channel Setting Pacing Amplitude: 2.5 V
Lead Channel Setting Pacing Pulse Width: 0.5 ms
Lead Channel Setting Sensing Sensitivity: 0.5 mV
Pulse Gen Serial Number: 7228287

## 2019-11-15 NOTE — Progress Notes (Signed)
Remote ICD transmission.   

## 2019-11-19 ENCOUNTER — Other Ambulatory Visit (HOSPITAL_COMMUNITY): Payer: Self-pay | Admitting: Cardiology

## 2019-12-19 ENCOUNTER — Other Ambulatory Visit (HOSPITAL_COMMUNITY): Payer: Self-pay | Admitting: Cardiology

## 2020-02-09 ENCOUNTER — Ambulatory Visit (INDEPENDENT_AMBULATORY_CARE_PROVIDER_SITE_OTHER): Payer: 59

## 2020-02-09 DIAGNOSIS — I5022 Chronic systolic (congestive) heart failure: Secondary | ICD-10-CM

## 2020-02-09 DIAGNOSIS — I428 Other cardiomyopathies: Secondary | ICD-10-CM | POA: Diagnosis not present

## 2020-02-14 LAB — CUP PACEART REMOTE DEVICE CHECK
Battery Remaining Longevity: 53 mo
Battery Remaining Percentage: 52 %
Battery Voltage: 2.92 V
Brady Statistic RV Percent Paced: 1 %
Date Time Interrogation Session: 20211113184508
HighPow Impedance: 63 Ohm
HighPow Impedance: 63 Ohm
Implantable Lead Implant Date: 20150925
Implantable Lead Location: 753860
Implantable Lead Model: 181
Implantable Lead Serial Number: 330358
Implantable Pulse Generator Implant Date: 20150925
Lead Channel Impedance Value: 350 Ohm
Lead Channel Pacing Threshold Amplitude: 1 V
Lead Channel Pacing Threshold Pulse Width: 0.5 ms
Lead Channel Sensing Intrinsic Amplitude: 5.8 mV
Lead Channel Setting Pacing Amplitude: 2.5 V
Lead Channel Setting Pacing Pulse Width: 0.5 ms
Lead Channel Setting Sensing Sensitivity: 0.5 mV
Pulse Gen Serial Number: 7228287

## 2020-02-14 NOTE — Progress Notes (Signed)
Remote ICD transmission.   

## 2020-03-18 ENCOUNTER — Other Ambulatory Visit (HOSPITAL_COMMUNITY): Payer: Self-pay | Admitting: Cardiology

## 2020-04-17 ENCOUNTER — Other Ambulatory Visit (HOSPITAL_COMMUNITY): Payer: Self-pay | Admitting: Cardiology

## 2020-04-19 ENCOUNTER — Other Ambulatory Visit (HOSPITAL_COMMUNITY): Payer: Self-pay

## 2020-04-19 MED ORDER — ATORVASTATIN CALCIUM 20 MG PO TABS: ORAL_TABLET | ORAL | 3 refills | Status: DC

## 2020-04-25 NOTE — Progress Notes (Unsigned)
error 

## 2020-04-26 ENCOUNTER — Ambulatory Visit: Payer: Self-pay

## 2020-04-26 NOTE — Progress Notes (Unsigned)
error 

## 2020-05-17 ENCOUNTER — Other Ambulatory Visit (HOSPITAL_COMMUNITY): Payer: Self-pay | Admitting: Cardiology

## 2020-05-22 ENCOUNTER — Ambulatory Visit (INDEPENDENT_AMBULATORY_CARE_PROVIDER_SITE_OTHER): Payer: 59

## 2020-05-22 DIAGNOSIS — I428 Other cardiomyopathies: Secondary | ICD-10-CM | POA: Diagnosis not present

## 2020-05-23 LAB — CUP PACEART REMOTE DEVICE CHECK
Battery Remaining Longevity: 52 mo
Battery Remaining Percentage: 50 %
Battery Voltage: 2.9 V
Brady Statistic RV Percent Paced: 1 %
Date Time Interrogation Session: 20220219112016
HighPow Impedance: 69 Ohm
HighPow Impedance: 69 Ohm
Implantable Lead Implant Date: 20150925
Implantable Lead Location: 753860
Implantable Lead Model: 181
Implantable Lead Serial Number: 330358
Implantable Pulse Generator Implant Date: 20150925
Lead Channel Impedance Value: 360 Ohm
Lead Channel Pacing Threshold Amplitude: 1 V
Lead Channel Pacing Threshold Pulse Width: 0.5 ms
Lead Channel Sensing Intrinsic Amplitude: 6.4 mV
Lead Channel Setting Pacing Amplitude: 2.5 V
Lead Channel Setting Pacing Pulse Width: 0.5 ms
Lead Channel Setting Sensing Sensitivity: 0.5 mV
Pulse Gen Serial Number: 7228287

## 2020-05-26 NOTE — Progress Notes (Signed)
Remote ICD transmission.   

## 2020-07-15 ENCOUNTER — Other Ambulatory Visit: Payer: Self-pay

## 2020-07-15 ENCOUNTER — Emergency Department (HOSPITAL_COMMUNITY)
Admission: EM | Admit: 2020-07-15 | Discharge: 2020-07-15 | Disposition: A | Discharge status: Home / Self Care | Payer: 59 | Attending: Emergency Medicine | Admitting: Emergency Medicine

## 2020-07-15 ENCOUNTER — Emergency Department (HOSPITAL_COMMUNITY): Payer: 59

## 2020-07-15 DIAGNOSIS — I5022 Chronic systolic (congestive) heart failure: Secondary | ICD-10-CM | POA: Insufficient documentation

## 2020-07-15 DIAGNOSIS — M25512 Pain in left shoulder: Secondary | ICD-10-CM | POA: Insufficient documentation

## 2020-07-15 DIAGNOSIS — Y92410 Unspecified street and highway as the place of occurrence of the external cause: Secondary | ICD-10-CM | POA: Insufficient documentation

## 2020-07-15 DIAGNOSIS — M25511 Pain in right shoulder: Secondary | ICD-10-CM | POA: Insufficient documentation

## 2020-07-15 DIAGNOSIS — I110 Hypertensive heart disease with heart failure: Secondary | ICD-10-CM | POA: Insufficient documentation

## 2020-07-15 DIAGNOSIS — J45909 Unspecified asthma, uncomplicated: Secondary | ICD-10-CM | POA: Insufficient documentation

## 2020-07-15 DIAGNOSIS — Z79899 Other long term (current) drug therapy: Secondary | ICD-10-CM | POA: Insufficient documentation
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
## 2020-07-15 NOTE — Discharge Instructions (Signed)
Please return for any problem.  °

## 2020-07-17 ENCOUNTER — Encounter: Payer: Self-pay | Admitting: Family Medicine

## 2020-07-17 ENCOUNTER — Other Ambulatory Visit: Payer: Self-pay

## 2020-07-17 ENCOUNTER — Ambulatory Visit (INDEPENDENT_AMBULATORY_CARE_PROVIDER_SITE_OTHER): Payer: 59 | Admitting: Family Medicine

## 2020-07-17 DIAGNOSIS — M25511 Pain in right shoulder: Secondary | ICD-10-CM
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
## 2020-08-15 ENCOUNTER — Other Ambulatory Visit (HOSPITAL_COMMUNITY): Payer: Self-pay | Admitting: Cardiology

## 2020-08-15 ENCOUNTER — Telehealth (HOSPITAL_COMMUNITY): Payer: Self-pay | Admitting: Cardiology

## 2020-08-16 ENCOUNTER — Other Ambulatory Visit (HOSPITAL_COMMUNITY): Payer: Self-pay

## 2020-08-16 MED ORDER — FENOFIBRATE 48 MG PO TABS: ORAL_TABLET | ORAL | 0 refills | Status: DC

## 2020-08-17 LAB — CUP PACEART REMOTE DEVICE CHECK
Battery Remaining Longevity: 49 mo
Battery Remaining Percentage: 48 %
Battery Voltage: 2.9 V
Brady Statistic RV Percent Paced: 1 %
Date Time Interrogation Session: 20220519045205
HighPow Impedance: 71 Ohm
HighPow Impedance: 71 Ohm
Implantable Lead Implant Date: 20150925
Implantable Lead Location: 753860
Implantable Lead Model: 181
Implantable Lead Serial Number: 330358
Implantable Pulse Generator Implant Date: 20150925
Lead Channel Impedance Value: 350 Ohm
Lead Channel Pacing Threshold Amplitude: 1 V
Lead Channel Pacing Threshold Pulse Width: 0.5 ms
Lead Channel Sensing Intrinsic Amplitude: 5.3 mV
Lead Channel Setting Pacing Amplitude: 2.5 V
Lead Channel Setting Pacing Pulse Width: 0.5 ms
Lead Channel Setting Sensing Sensitivity: 0.5 mV
Pulse Gen Serial Number: 7228287

## 2020-08-21 ENCOUNTER — Ambulatory Visit (INDEPENDENT_AMBULATORY_CARE_PROVIDER_SITE_OTHER): Payer: 59

## 2020-08-21 DIAGNOSIS — I428 Other cardiomyopathies: Secondary | ICD-10-CM | POA: Diagnosis not present

## 2020-09-11 NOTE — Progress Notes (Signed)
Remote ICD transmission.   

## 2020-09-12 ENCOUNTER — Telehealth: Payer: Self-pay | Admitting: Family Medicine

## 2020-09-12 NOTE — Telephone Encounter (Signed)
I called the patient, after speaking with the referral coordinator, and left a voice message: the referral will be sent to the hospital, to see if they can do this procedure, with his having a defibrillator. If so, they will contact the patient to schedule and appointment. If not, then we will see what the next steps will be, instead, for his shoulder.

## 2020-09-12 NOTE — Telephone Encounter (Signed)
Pt states he made an appt with to get an MRI but the facility told him they could not do it because he had a defibrillator. Pt calling to figure out what the next steps are or if there is another facility he can go to that will not turn him away. Pt said the best call back number is [phone] and he is at work so he might not answer but said okay to leave a vm.

## 2020-10-04 ENCOUNTER — Telehealth: Payer: Self-pay

## 2020-10-04 ENCOUNTER — Other Ambulatory Visit: Payer: Self-pay | Admitting: Family Medicine

## 2020-10-04 DIAGNOSIS — M25511 Pain in right shoulder: Secondary | ICD-10-CM

## 2020-10-04 NOTE — Telephone Encounter (Signed)
Received call concerning MRI

## 2020-10-05 ENCOUNTER — Telehealth: Payer: Self-pay

## 2020-10-05 DIAGNOSIS — M25511 Pain in right shoulder: Secondary | ICD-10-CM

## 2020-10-05 NOTE — Addendum Note (Signed)
Addended by: Lillia Carmel on: 10/05/2020 10:38 AM   Modules accepted: Orders

## 2020-10-05 NOTE — Telephone Encounter (Signed)
Please see April's note.

## 2020-10-05 NOTE — Telephone Encounter (Signed)
Edward Greene with [facility] radiology called stating that patient has a pacemaker and that it's not safe for him to have an MRI.  Stated patient should have a CT scan instead.  Cb# [phone].  Please advise.  Thank you.

## 2020-10-09 NOTE — Telephone Encounter (Signed)
noted 

## 2020-10-14 ENCOUNTER — Other Ambulatory Visit: Payer: Self-pay | Admitting: Cardiology

## 2020-10-19 ENCOUNTER — Encounter (HOSPITAL_COMMUNITY): Payer: 59 | Admitting: Cardiology

## 2020-11-13 ENCOUNTER — Other Ambulatory Visit (HOSPITAL_COMMUNITY): Payer: Self-pay

## 2020-11-13 MED ORDER — CARVEDILOL 25 MG PO TABS: ORAL_TABLET | ORAL | 0 refills | Status: DC

## 2020-11-20 ENCOUNTER — Ambulatory Visit (INDEPENDENT_AMBULATORY_CARE_PROVIDER_SITE_OTHER): Payer: 59

## 2020-11-20 DIAGNOSIS — I428 Other cardiomyopathies: Secondary | ICD-10-CM | POA: Diagnosis not present

## 2020-11-20 LAB — CUP PACEART REMOTE DEVICE CHECK
Battery Remaining Longevity: 48 mo
Battery Remaining Percentage: 47 %
Battery Voltage: 2.9 V
Brady Statistic RV Percent Paced: 1 %
Date Time Interrogation Session: 20220819172012
HighPow Impedance: 69 Ohm
HighPow Impedance: 69 Ohm
Implantable Lead Implant Date: 20150925
Implantable Lead Location: 753860
Implantable Lead Model: 181
Implantable Lead Serial Number: 330358
Implantable Pulse Generator Implant Date: 20150925
Lead Channel Impedance Value: 350 Ohm
Lead Channel Pacing Threshold Amplitude: 1 V
Lead Channel Pacing Threshold Pulse Width: 0.5 ms
Lead Channel Sensing Intrinsic Amplitude: 5.9 mV
Lead Channel Setting Pacing Amplitude: 2.5 V
Lead Channel Setting Pacing Pulse Width: 0.5 ms
Lead Channel Setting Sensing Sensitivity: 0.5 mV
Pulse Gen Serial Number: 7228287

## 2020-12-06 NOTE — Progress Notes (Signed)
Remote ICD transmission.   

## 2020-12-11 ENCOUNTER — Other Ambulatory Visit (HOSPITAL_COMMUNITY): Payer: Self-pay

## 2020-12-11 DIAGNOSIS — I5022 Chronic systolic (congestive) heart failure: Secondary | ICD-10-CM

## 2020-12-11 DIAGNOSIS — I428 Other cardiomyopathies: Secondary | ICD-10-CM

## 2020-12-11 MED ORDER — ISOSORB DINITRATE-HYDRALAZINE 20-37.5 MG PO TABS: 0.5000 | ORAL_TABLET | Freq: Three times a day (TID) | ORAL | 0 refills | Status: DC

## 2020-12-13 ENCOUNTER — Other Ambulatory Visit: Payer: Self-pay | Admitting: Cardiology

## 2021-01-02 ENCOUNTER — Encounter (HOSPITAL_COMMUNITY): Payer: Self-pay | Admitting: Cardiology

## 2021-01-02 ENCOUNTER — Ambulatory Visit (HOSPITAL_COMMUNITY)
Admission: RE | Admit: 2021-01-02 | Discharge: 2021-01-02 | Disposition: A | Discharge status: Home / Self Care | Payer: 59 | Source: Ambulatory Visit | Attending: Cardiology | Admitting: Cardiology

## 2021-01-02 ENCOUNTER — Other Ambulatory Visit: Payer: Self-pay

## 2021-01-02 ENCOUNTER — Other Ambulatory Visit (HOSPITAL_COMMUNITY): Payer: Self-pay

## 2021-01-02 VITALS — BP 106/80 | HR 84 | Wt 200.2 lb

## 2021-01-02 DIAGNOSIS — Z95810 Presence of automatic (implantable) cardiac defibrillator: Secondary | ICD-10-CM | POA: Diagnosis not present

## 2021-01-02 DIAGNOSIS — I5022 Chronic systolic (congestive) heart failure: Secondary | ICD-10-CM | POA: Diagnosis present

## 2021-01-02 DIAGNOSIS — E785 Hyperlipidemia, unspecified: Secondary | ICD-10-CM | POA: Insufficient documentation

## 2021-01-02 DIAGNOSIS — N183 Chronic kidney disease, stage 3 unspecified: Secondary | ICD-10-CM | POA: Insufficient documentation

## 2021-01-02 DIAGNOSIS — I428 Other cardiomyopathies: Secondary | ICD-10-CM | POA: Insufficient documentation

## 2021-01-02 DIAGNOSIS — I130 Hypertensive heart and chronic kidney disease with heart failure and stage 1 through stage 4 chronic kidney disease, or unspecified chronic kidney disease: Secondary | ICD-10-CM | POA: Diagnosis not present

## 2021-01-02 DIAGNOSIS — Z79899 Other long term (current) drug therapy: Secondary | ICD-10-CM | POA: Diagnosis not present

## 2021-01-02 DIAGNOSIS — Z8249 Family history of ischemic heart disease and other diseases of the circulatory system: Secondary | ICD-10-CM | POA: Diagnosis not present

## 2021-01-02 LAB — BASIC METABOLIC PANEL
Anion gap: 7 (ref 5–15)
BUN: 24 mg/dL — ABNORMAL HIGH (ref 6–20)
CO2: 24 mmol/L (ref 22–32)
Calcium: 9.3 mg/dL (ref 8.9–10.3)
Chloride: 107 mmol/L (ref 98–111)
Creatinine, Ser: 2.01 mg/dL — ABNORMAL HIGH (ref 0.61–1.24)
GFR, Estimated: 37 mL/min — ABNORMAL LOW (ref >60)
Glucose, Bld: 100 mg/dL — ABNORMAL HIGH (ref 70–99)
Potassium: 4.5 mmol/L (ref 3.5–5.1)
Sodium: 138 mmol/L (ref 135–145)

## 2021-01-02 LAB — CBC
HCT: 39.9 % (ref 39.0–52.0)
Hemoglobin: 12.3 g/dL — ABNORMAL LOW (ref 13.0–17.0)
MCH: 28.4 pg (ref 26.0–34.0)
MCHC: 30.8 g/dL (ref 30.0–36.0)
MCV: 92.1 fL (ref 80.0–100.0)
Platelets: 204 10*3/uL (ref 150–400)
RBC: 4.33 MIL/uL (ref 4.22–5.81)
RDW: 13.1 % (ref 11.5–15.5)
WBC: 6.8 10*3/uL (ref 4.0–10.5)
nRBC: 0 % (ref 0.0–0.2)

## 2021-01-02 LAB — BRAIN NATRIURETIC PEPTIDE: B Natriuretic Peptide: 7.4 pg/mL (ref 0.0–100.0)

## 2021-01-02 MED ORDER — EMPAGLIFLOZIN 10 MG PO TABS: 10.0000 mg | ORAL_TABLET | Freq: Every day | ORAL | 11 refills | Status: DC

## 2021-01-02 NOTE — Patient Instructions (Addendum)
EKG done today.  Labs done today. We will contact you only if your labs are abnormal.  START Jardiance 10mg  (1 tablet) by mouth daily.   No other medication changes were made. Please continue all current medications as prescribed.  Your physician recommends that you schedule a follow-up appointment in 10 days for a lab only appointment and 3 months with Dr. with an echo prior to your exam. Please contact our office in December to schedule a January 2023 appointment.   Your physician has requested that you have an echocardiogram. Echocardiography is a painless test that uses sound waves to create images of your heart. It provides your doctor with information about the size and shape of your heart and how well your heart's chambers and valves are working. This procedure takes approximately one hour. There are no restrictions for this procedure.  If you have any questions or concerns before your next appointment please send February 2023 a message through [place] or call our office at [phone].    TO LEAVE A MESSAGE FOR THE NURSE SELECT OPTION 2, PLEASE LEAVE A MESSAGE INCLUDING: YOUR NAME DATE OF BIRTH CALL BACK NUMBER REASON FOR CALL**this is important as we prioritize the call backs  YOU WILL RECEIVE A CALL BACK THE SAME DAY AS LONG AS YOU CALL BEFORE 4:00 PM   Do the following things EVERYDAY: Weigh yourself in the morning before breakfast. Write it down and keep it in a log. Take your medicines as prescribed Eat low salt foods--Limit salt (sodium) to 2000 mg per day.  Stay as active as you can everyday Limit all fluids for the day to less than 2 liters   At the [facility], you and your health needs are our priority. As part of our continuing mission to provide you with exceptional heart care, we have created designated Provider Care Teams. These Care Teams include your primary Cardiologist (physician) and Advanced Practice Providers (APPs- Physician Assistants and  Nurse Practitioners) who all work together to provide you with the care you need, when you need it.   You may see any of the following providers on your designated Care Team at your next follow up: Dr [phone] Dr Arvilla Meres, NP Carron Curie, Robbie Lis [place], PharmD   Please be sure to bring in all your medications bottles to every appointment.

## 2021-01-03 LAB — LIPID PANEL
Cholesterol: 154 mg/dL (ref 0–200)
HDL: 23 mg/dL — ABNORMAL LOW (ref >40)
LDL Cholesterol: 71 mg/dL (ref 0–99)
Total CHOL/HDL Ratio: 6.7 RATIO
Triglycerides: 300 mg/dL — ABNORMAL HIGH (ref <150)
VLDL: 60 mg/dL — ABNORMAL HIGH (ref 0–40)

## 2021-01-03 NOTE — Progress Notes (Signed)
Patient ID: Edward Greene, male   DOB: [birth year], 60 y.o.   MRN: [id number]  Cardiology: Dr. Shirlee Latch  60 y.o. with minimal past history presented initially after hospitalization with acute systolic CHF.  In 9/12, patient began to notice dyspnea with heavy exertion, such as when he carried something up a flight of steps. He also was having difficulty sleeping with symptoms that sounded consistent with orthopnea.  No chest pain.  He went to urgent care for evaluation, and CXR showed pulmonary edema.  BNP was elevated and ECG was abnormal.  He was sent to the ER.  Evaluation showed CHF with volume overload.  Echo showed EF 15% with moderate to severe LV dilation and multiple wall motion abnormalities.  Cardiac enzymes were negative.  Patient was diuresed and started on cardiac meds.  Left heart cath in 9/12 showed no angiographic CAD.  Cardiac MRI (9/12) showed EF 15%, moderate to severe LV dilation, mild RV dysfunction, and no delayed enhancement.  Cardiopulmonary exercise test, however, showed no significant functional limitation with VO2 max 33.6 and normal VE/VCO2 slope.  Echo 3/13 showed EF 25-30%.  Last echo in 1/15 showed EF remained 25-30% with moderate LV dilation and global hypokinesis.  There were prominent apical trabeculations concerning for possilbe noncompaction, RV was normal.  CPX was repeated in 2/15.  Peak VO2 was good at 26 but lower than prior.  VE/VCO2 slope 27 with RER 1.26.  He was seen by Dr. Graciela Husbands and had [facility] ICD placed.    Echo in 3/19 showed increase in EF to 55-60%, significant improvement.  Echo in 10/21 showed EF 40-45% with ?noncompaction, normal RV.   He returns for followup of CHF.  Main complaint is fatigue, still working two jobs and not getting a lot of sleep.  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  Weight down 3 lbs.  No significant exertional dyspnea, no chest pain, no lightheadedness.  No orthopnea/PND.  Excellent exercise tolerance.    St Jude device  interrogation: stable thoracic impedance.   ECG (personally reviewed): NSR, diffuse TWIs  Labs (9/12): K 4.3, creatinine 1.7 => 1.6, pro-BNP 907=>218, SPEP negative, HIV negative, transferrin saturation 47%, LDL 145, HDL 33 Labs (10/12): K 3.5, creatinine 1.4, LDL 55, HDL 38, BNP 81 Labs (11/12): K 4, creatinine 1.4 Labs (3/15): K 3.9, creatinine 1.6 Labs (6/15): K 4.2, creatinine 1.45, LDL 38, HDL 20 Labs (1/17): K 4, creatinine 1.59 Labs (3/19): TGs 360, LDL 67 Labs (4/19): K 4.3, creatinine 1.73 Labs (7/19): K 3.9, creatinine 1.78, LDL 75 Labs (10/20): LDL 107 Labs (1/21): K 3.8, creatinine 1.88  PMH: 1. Childhood asthma 2. Hyperlipidemia 3. Systolic CHF: Echo (9/12) with EF 15%, moderate-severe LV dilation, inferior/posterior/inferoseptal/apical akinesis, septal/anterolateral severe hypokinesis, mild to moderate RV dilation with normal RV systolic function.  HIV negative, SPEP negative.  No history of ETOH or drugs.  No history of HTN.  LHC (9/12) with no angiographic CAD. Possible viral myocarditis versus familial cardiomyopathy.  Cardiac MRI (9/12) with EF 15%, moderate to severe LV dilation, mild RV dysfunction, no myocardial delayed enhancement.  Echo (1/13): EF 15%, diffuse HK, grade I diastolic dysfunction.  CPX (1/13): VO2 max 33.6, VE/VCO2 normal.  Echo (3/13): EF 25-30%, global hypokinesis, normal RV.  Echo (1/15): EF 25-30%, moderate LV dilation, global hypokinesis, prominent apical trabeculations, normal RV.  CPX (2/15): RER 1.26, peak VO2 26, VE/VCO2 27.  - St Jude ICD - Echo (3/19) with EF 55-60%.   4. ACEI cough  5. CKD stage III 6. H/o NSVT  FH: Father with CHF in his 58s, thinks he had an MI.  Father has PCM.  Father had 9 brothers, all had MIs or CHF.  Sister with MI in her 88s.   SH: Lives in [place] with his wife.  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  No tobacco, no ETOH, no no drugs.   ROS: All systems reviewed and negative  except as per HPI.   Current Outpatient Medications  Medication Sig Dispense Refill   atorvastatin (LIPITOR) 20 MG tablet TAKE 1 TABLET BY MOUTH EVERY DAY. NEEDS APPT FOR FUTURE REFILLS 90 tablet 3   carvedilol (COREG) 25 MG tablet TAKE 1 TABLET BY MOUTH TWICE DAILY WITH A MEAL 120 tablet 0   empagliflozin (JARDIANCE) 10 MG TABS tablet Take 1 tablet (10 mg total) by mouth daily before breakfast. 30 tablet 11   fenofibrate (TRICOR) 48 MG tablet TAKE 1 TABLET(48 MG) BY MOUTH DAILY. MUST KEEP APPT FOR FURTHER REFILLS 60 tablet 0   ibuprofen (ADVIL) 200 MG tablet Take 200 mg by mouth every 6 (six) hours as needed.     isosorbide-hydrALAZINE (BIDIL) 20-37.5 MG tablet Take 0.5 tablets by mouth 3 (three) times daily. Must keep appointments for further refills 30 tablet 0   sacubitril-valsartan (ENTRESTO) 24-26 MG Take 1 tablet by mouth 2 (two) times daily. Must keep follow up appt for further refills 180 tablet 0   spironolactone (ALDACTONE) 25 MG tablet TAKE 1 TABLET BY MOUTH DAILY 30 tablet 6   No current facility-administered medications for this encounter.    BP 106/80   Pulse 84   Wt 90.8 kg (200 lb 3.2 oz)   SpO2 95%   BMI 31.36 kg/m  General: NAD Neck: No JVD, no thyromegaly or thyroid nodule.  Lungs: Clear to auscultation bilaterally with normal respiratory effort. CV: Nondisplaced PMI.  Heart regular S1/S2, no S3/S4, no murmur.  No peripheral edema.  No carotid bruit.  Normal pedal pulses.  Abdomen: Soft, nontender, no hepatosplenomegaly, no distention.  Skin: Intact without lesions or rashes.  Neurologic: Alert and oriented x 3.  Psych: Normal affect. Extremities: No clubbing or cyanosis.  HEENT: Normal.   Assessment/Plan: 1. Chronic systolic CHF: Nonischemic cardiomyopathy.  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  EF was 25-30% echo in 1/15.  He has a Secondary school teacher ICD. CPX in 2/15 showed overall normal functional capacity but he did not do as  well as in 2013.  Echo in 3/19 showed improvement in EF back to normal range, 55-60%, but echo in 10/20 showed EF down a bit to 40-45%.  NYHA class I symptoms.  He is not volume overloaded on exam.   - No Lasix needed at this time.  - Continue Coreg 25 bid and spironolactone 25 daily - Continue Entresto 24/26 bid, will not increase with low BP at times.  - Continue Bidil 1/2 tab tid, will not increase with low BP at times.  - Add Jardiance 10 mg daily versus Farxiga 10 mg daily (depending on insurance preference).  BMET today and in 10 days.   - I will arrange for repeat echo.   2. Hyperlipidemia: Continue atorvastatin, check lipids.  3. HTN: BP controlled.   4. CKD: Stage 3.   - BMET today.  - I will add SGLT2 inhibitor as above.   Followup 3 months  Marca Ancona 01/03/2021

## 2021-01-10 ENCOUNTER — Other Ambulatory Visit (HOSPITAL_COMMUNITY): Payer: Self-pay

## 2021-01-11 ENCOUNTER — Telehealth (HOSPITAL_COMMUNITY): Payer: Self-pay

## 2021-01-11 MED ORDER — ICOSAPENT ETHYL 1 G PO CAPS: 2.0000 g | ORAL_CAPSULE | Freq: Two times a day (BID) | ORAL | 11 refills | Status: DC

## 2021-01-11 NOTE — Telephone Encounter (Signed)
-----   Message from Laurey Morale, MD sent at 01/03/2021  4:15 PM EDT ----- Triglycerides significantly elevated, add Vascepa 2 g bid.

## 2021-01-11 NOTE — Telephone Encounter (Addendum)
Patient advised and verbalized understanding. New Rx sent into patients pharmacy  Meds ordered this encounter  Medications   icosapent Ethyl (VASCEPA) 1 g capsule    Sig: Take 2 capsules (2 g total) by mouth 2 (two) times daily.    Dispense:  120 capsule    Refill:  11

## 2021-01-12 ENCOUNTER — Other Ambulatory Visit: Payer: Self-pay | Admitting: Cardiology

## 2021-01-15 ENCOUNTER — Ambulatory Visit (HOSPITAL_COMMUNITY)
Admission: RE | Admit: 2021-01-15 | Discharge: 2021-01-15 | Disposition: A | Discharge status: Home / Self Care | Payer: 59 | Source: Ambulatory Visit | Attending: Cardiology | Admitting: Cardiology

## 2021-01-15 ENCOUNTER — Other Ambulatory Visit: Payer: Self-pay

## 2021-01-15 DIAGNOSIS — I5022 Chronic systolic (congestive) heart failure: Secondary | ICD-10-CM

## 2021-01-15 LAB — BASIC METABOLIC PANEL
Anion gap: 8 (ref 5–15)
BUN: 26 mg/dL — ABNORMAL HIGH (ref 6–20)
CO2: 24 mmol/L (ref 22–32)
Calcium: 9.6 mg/dL (ref 8.9–10.3)
Chloride: 107 mmol/L (ref 98–111)
Creatinine, Ser: 1.73 mg/dL — ABNORMAL HIGH (ref 0.61–1.24)
GFR, Estimated: 45 mL/min — ABNORMAL LOW (ref >60)
Glucose, Bld: 131 mg/dL — ABNORMAL HIGH (ref 70–99)
Potassium: 4.3 mmol/L (ref 3.5–5.1)
Sodium: 139 mmol/L (ref 135–145)

## 2021-01-22 ENCOUNTER — Other Ambulatory Visit (HOSPITAL_COMMUNITY): Payer: Self-pay

## 2021-01-22 MED ORDER — SPIRONOLACTONE 25 MG PO TABS: 25.0000 mg | ORAL_TABLET | Freq: Every day | ORAL | 6 refills | Status: DC

## 2021-01-30 ENCOUNTER — Other Ambulatory Visit (HOSPITAL_COMMUNITY): Payer: Self-pay

## 2021-01-30 DIAGNOSIS — I5022 Chronic systolic (congestive) heart failure: Secondary | ICD-10-CM

## 2021-01-30 DIAGNOSIS — I428 Other cardiomyopathies: Secondary | ICD-10-CM

## 2021-01-30 MED ORDER — ISOSORB DINITRATE-HYDRALAZINE 20-37.5 MG PO TABS
0.5000 | ORAL_TABLET | Freq: Three times a day (TID) | ORAL | 0 refills | Status: DC
Start: 2021-01-30 — End: 2021-05-14

## 2021-02-11 ENCOUNTER — Other Ambulatory Visit: Payer: Self-pay | Admitting: Cardiology

## 2021-02-19 ENCOUNTER — Ambulatory Visit: Payer: 59

## 2021-03-04 ENCOUNTER — Other Ambulatory Visit: Payer: Self-pay | Admitting: Cardiology

## 2021-04-01 LAB — CUP PACEART REMOTE DEVICE CHECK
Battery Remaining Longevity: 44 mo
Battery Remaining Percentage: 44 %
Battery Voltage: 2.89 V
Brady Statistic RV Percent Paced: 1 %
Date Time Interrogation Session: 20221231210803
HighPow Impedance: 71 Ohm
HighPow Impedance: 71 Ohm
Implantable Lead Implant Date: 20150925
Implantable Lead Location: 753860
Implantable Lead Model: 181
Implantable Lead Serial Number: 330358
Implantable Pulse Generator Implant Date: 20150925
Lead Channel Impedance Value: 340 Ohm
Lead Channel Pacing Threshold Amplitude: 1 V
Lead Channel Pacing Threshold Pulse Width: 0.5 ms
Lead Channel Sensing Intrinsic Amplitude: 5.6 mV
Lead Channel Setting Pacing Amplitude: 2.5 V
Lead Channel Setting Pacing Pulse Width: 0.5 ms
Lead Channel Setting Sensing Sensitivity: 0.5 mV
Pulse Gen Serial Number: 7228287

## 2021-04-03 ENCOUNTER — Ambulatory Visit (INDEPENDENT_AMBULATORY_CARE_PROVIDER_SITE_OTHER): Payer: 59

## 2021-04-03 DIAGNOSIS — I428 Other cardiomyopathies: Secondary | ICD-10-CM | POA: Diagnosis not present

## 2021-04-11 NOTE — Progress Notes (Signed)
Remote ICD transmission.   

## 2021-05-12 ENCOUNTER — Other Ambulatory Visit (HOSPITAL_COMMUNITY): Payer: Self-pay | Admitting: Cardiology

## 2021-05-12 DIAGNOSIS — I5022 Chronic systolic (congestive) heart failure: Secondary | ICD-10-CM

## 2021-05-12 DIAGNOSIS — I428 Other cardiomyopathies: Secondary | ICD-10-CM

## 2021-05-14 ENCOUNTER — Other Ambulatory Visit (HOSPITAL_COMMUNITY): Payer: Self-pay

## 2021-05-14 MED ORDER — ATORVASTATIN CALCIUM 20 MG PO TABS: ORAL_TABLET | ORAL | 0 refills | Status: DC

## 2021-05-21 ENCOUNTER — Telehealth (HOSPITAL_COMMUNITY): Payer: Self-pay | Admitting: Cardiology

## 2021-05-21 NOTE — Telephone Encounter (Signed)
Hi I need order for echo and linked. Thanks

## 2021-06-12 ENCOUNTER — Other Ambulatory Visit (HOSPITAL_COMMUNITY): Payer: Self-pay

## 2021-06-12 MED ORDER — CARVEDILOL 25 MG PO TABS: 25.0000 mg | ORAL_TABLET | Freq: Two times a day (BID) | ORAL | 0 refills | Status: DC

## 2021-07-03 ENCOUNTER — Ambulatory Visit (INDEPENDENT_AMBULATORY_CARE_PROVIDER_SITE_OTHER): Payer: 59

## 2021-07-03 DIAGNOSIS — I428 Other cardiomyopathies: Secondary | ICD-10-CM

## 2021-07-09 LAB — CUP PACEART REMOTE DEVICE CHECK
Battery Remaining Longevity: 41 mo
Battery Remaining Percentage: 40 %
Battery Voltage: 2.87 V
Brady Statistic RV Percent Paced: 1 %
Date Time Interrogation Session: 20230409124343
HighPow Impedance: 68 Ohm
HighPow Impedance: 68 Ohm
Implantable Lead Implant Date: 20150925
Implantable Lead Location: 753860
Implantable Lead Model: 181
Implantable Lead Serial Number: 330358
Implantable Pulse Generator Implant Date: 20150925
Lead Channel Impedance Value: 340 Ohm
Lead Channel Pacing Threshold Amplitude: 1 V
Lead Channel Pacing Threshold Pulse Width: 0.5 ms
Lead Channel Sensing Intrinsic Amplitude: 7.4 mV
Lead Channel Setting Pacing Amplitude: 2.5 V
Lead Channel Setting Pacing Pulse Width: 0.5 ms
Lead Channel Setting Sensing Sensitivity: 0.5 mV
Pulse Gen Serial Number: 7228287

## 2021-07-17 NOTE — Progress Notes (Signed)
Remote ICD transmission.   

## 2021-07-19 ENCOUNTER — Other Ambulatory Visit (HOSPITAL_COMMUNITY): Payer: Self-pay

## 2021-07-19 DIAGNOSIS — I5022 Chronic systolic (congestive) heart failure: Secondary | ICD-10-CM

## 2021-07-26 ENCOUNTER — Encounter (HOSPITAL_COMMUNITY): Payer: Self-pay | Admitting: Cardiology

## 2021-07-26 ENCOUNTER — Ambulatory Visit (HOSPITAL_BASED_OUTPATIENT_CLINIC_OR_DEPARTMENT_OTHER)
Admission: RE | Admit: 2021-07-26 | Discharge: 2021-07-26 | Disposition: A | Discharge status: Home / Self Care | Payer: 59 | Source: Ambulatory Visit | Attending: Cardiology | Admitting: Cardiology

## 2021-07-26 ENCOUNTER — Ambulatory Visit (HOSPITAL_COMMUNITY)
Admission: RE | Admit: 2021-07-26 | Discharge: 2021-07-26 | Disposition: A | Discharge status: Home / Self Care | Payer: 59 | Source: Ambulatory Visit | Attending: Cardiology | Admitting: Cardiology

## 2021-07-26 VITALS — BP 116/70 | HR 54 | Wt 194.0 lb

## 2021-07-26 DIAGNOSIS — K7689 Other specified diseases of liver: Secondary | ICD-10-CM | POA: Diagnosis not present

## 2021-07-26 DIAGNOSIS — R0600 Dyspnea, unspecified: Secondary | ICD-10-CM | POA: Insufficient documentation

## 2021-07-26 DIAGNOSIS — Z7984 Long term (current) use of oral hypoglycemic drugs: Secondary | ICD-10-CM | POA: Diagnosis not present

## 2021-07-26 DIAGNOSIS — N183 Chronic kidney disease, stage 3 unspecified: Secondary | ICD-10-CM | POA: Diagnosis not present

## 2021-07-26 DIAGNOSIS — I428 Other cardiomyopathies: Secondary | ICD-10-CM | POA: Diagnosis not present

## 2021-07-26 DIAGNOSIS — I5022 Chronic systolic (congestive) heart failure: Secondary | ICD-10-CM | POA: Insufficient documentation

## 2021-07-26 DIAGNOSIS — I130 Hypertensive heart and chronic kidney disease with heart failure and stage 1 through stage 4 chronic kidney disease, or unspecified chronic kidney disease: Secondary | ICD-10-CM | POA: Diagnosis not present

## 2021-07-26 DIAGNOSIS — Z79899 Other long term (current) drug therapy: Secondary | ICD-10-CM | POA: Diagnosis not present

## 2021-07-26 DIAGNOSIS — Z95810 Presence of automatic (implantable) cardiac defibrillator: Secondary | ICD-10-CM | POA: Diagnosis not present

## 2021-07-26 DIAGNOSIS — E785 Hyperlipidemia, unspecified: Secondary | ICD-10-CM

## 2021-07-26 LAB — LIPID PANEL
Cholesterol: 148 mg/dL (ref 0–200)
HDL: 24 mg/dL — ABNORMAL LOW (ref >40)
LDL Cholesterol: 99 mg/dL (ref 0–99)
Total CHOL/HDL Ratio: 6.2 RATIO
Triglycerides: 125 mg/dL (ref <150)
VLDL: 25 mg/dL (ref 0–40)

## 2021-07-26 LAB — BASIC METABOLIC PANEL
Anion gap: 5 (ref 5–15)
BUN: 21 mg/dL (ref 8–23)
CO2: 27 mmol/L (ref 22–32)
Calcium: 9.6 mg/dL (ref 8.9–10.3)
Chloride: 110 mmol/L (ref 98–111)
Creatinine, Ser: 1.78 mg/dL — ABNORMAL HIGH (ref 0.61–1.24)
GFR, Estimated: 43 mL/min — ABNORMAL LOW (ref >60)
Glucose, Bld: 105 mg/dL — ABNORMAL HIGH (ref 70–99)
Potassium: 4.6 mmol/L (ref 3.5–5.1)
Sodium: 142 mmol/L (ref 135–145)

## 2021-07-26 LAB — ECHOCARDIOGRAM COMPLETE
Area-P 1/2: 3.93 cm2
S' Lateral: 4 cm

## 2021-07-26 MED ORDER — ENTRESTO 49-51 MG PO TABS: 1.0000 | ORAL_TABLET | Freq: Two times a day (BID) | ORAL | 11 refills | Status: DC

## 2021-07-26 NOTE — Patient Instructions (Signed)
Medication Changes: ? ?Increase Entresto to 49/51 Twice daily ? ? ?Lab Work: ? ?Labs done today, your results will be available in MyChart, we will contact you for abnormal readings. ? ? ?Testing/Procedures: ? ?Repeat blood work in 10 days ? ?Referrals: ? ?none ? ?Special Instructions // Education: ? ?none ? ?Follow-Up in: 4 months  ? ?At the [facility], you and your health needs are our priority. We have a designated team specialized in the treatment of Heart Failure. This Care Team includes your primary Heart Failure Specialized Cardiologist (physician), Advanced Practice Providers (APPs- Physician Assistants and Nurse Practitioners), and Pharmacist who all work together to provide you with the care you need, when you need it.  ? ?You may see any of the following providers on your designated Care Team at your next follow up: ? ?Dr Arvilla Meres ?Dr Marca Ancona ?Tonye Becket, NP ?Robbie Lis, PA ?Jessica Milford,NP ?Anna Genre, PA ?Karle Plumber, PharmD ? ? ?Please be sure to bring in all your medications bottles to every appointment.  ? ?Need to Contact us: ? ?If you have any questions or concerns before your next appointment please send [place] a message through [place] or call our office at [phone].   ? ?TO LEAVE A MESSAGE FOR THE NURSE SELECT OPTION 2, PLEASE LEAVE A MESSAGE INCLUDING: ?YOUR NAME ?DATE OF BIRTH ?CALL BACK NUMBER ?REASON FOR CALL**this is important as we prioritize the call backs ? ?YOU WILL RECEIVE A CALL BACK THE SAME DAY AS LONG AS YOU CALL BEFORE 4:00 PM ? ? ?
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
## 2021-08-06 ENCOUNTER — Other Ambulatory Visit (HOSPITAL_COMMUNITY): Payer: 59

## 2021-08-10 ENCOUNTER — Other Ambulatory Visit: Payer: Self-pay | Admitting: Cardiology

## 2021-08-10 ENCOUNTER — Ambulatory Visit (HOSPITAL_COMMUNITY)
Admission: RE | Admit: 2021-08-10 | Discharge: 2021-08-10 | Disposition: A | Discharge status: Home / Self Care | Payer: 59 | Source: Ambulatory Visit | Attending: Cardiology | Admitting: Cardiology

## 2021-08-10 ENCOUNTER — Other Ambulatory Visit (HOSPITAL_COMMUNITY): Payer: Self-pay

## 2021-08-10 DIAGNOSIS — I5022 Chronic systolic (congestive) heart failure: Secondary | ICD-10-CM | POA: Insufficient documentation

## 2021-08-10 DIAGNOSIS — I428 Other cardiomyopathies: Secondary | ICD-10-CM

## 2021-08-10 LAB — BASIC METABOLIC PANEL
Anion gap: 6 (ref 5–15)
BUN: 25 mg/dL — ABNORMAL HIGH (ref 8–23)
CO2: 23 mmol/L (ref 22–32)
Calcium: 9.3 mg/dL (ref 8.9–10.3)
Chloride: 111 mmol/L (ref 98–111)
Creatinine, Ser: 2.22 mg/dL — ABNORMAL HIGH (ref 0.61–1.24)
GFR, Estimated: 33 mL/min — ABNORMAL LOW (ref >60)
Glucose, Bld: 106 mg/dL — ABNORMAL HIGH (ref 70–99)
Potassium: 4.4 mmol/L (ref 3.5–5.1)
Sodium: 140 mmol/L (ref 135–145)

## 2021-08-10 MED ORDER — ISOSORB DINITRATE-HYDRALAZINE 20-37.5 MG PO TABS: 0.5000 | ORAL_TABLET | Freq: Three times a day (TID) | ORAL | 1 refills | Status: DC

## 2021-08-10 MED ORDER — ATORVASTATIN CALCIUM 20 MG PO TABS: ORAL_TABLET | ORAL | 2 refills | Status: DC

## 2021-08-17 ENCOUNTER — Telehealth (HOSPITAL_COMMUNITY): Payer: Self-pay | Admitting: *Deleted

## 2021-08-17 ENCOUNTER — Other Ambulatory Visit (HOSPITAL_COMMUNITY): Payer: Self-pay | Admitting: *Deleted

## 2021-08-17 DIAGNOSIS — I5022 Chronic systolic (congestive) heart failure: Secondary | ICD-10-CM

## 2021-08-17 DIAGNOSIS — R748 Abnormal levels of other serum enzymes: Secondary | ICD-10-CM

## 2021-08-17 NOTE — Telephone Encounter (Signed)
Called patient per Dr. Shirlee Latch and left message asking him to do the following based on elevated creatinine on lab work here:  1. Increase fluid intake daily  2. Do not take ibuprofen for pain, only use Tylenol for pain  3. Repeat lab work Wednesday, 08/22/21 at 10:00 am 4. If any questions or if you need to re-schedule lab work to a different day/time, pleasecall [facility] Clinic at [phone].  Hessie Diener RN [facility] [phone]

## 2021-08-22 ENCOUNTER — Other Ambulatory Visit (HOSPITAL_COMMUNITY): Payer: 59

## 2021-09-09 ENCOUNTER — Other Ambulatory Visit (HOSPITAL_COMMUNITY): Payer: Self-pay | Admitting: Cardiology

## 2021-10-03 ENCOUNTER — Ambulatory Visit (INDEPENDENT_AMBULATORY_CARE_PROVIDER_SITE_OTHER): Payer: 59

## 2021-10-03 DIAGNOSIS — I428 Other cardiomyopathies: Secondary | ICD-10-CM

## 2021-10-08 LAB — CUP PACEART REMOTE DEVICE CHECK
Battery Remaining Longevity: 40 mo
Battery Remaining Percentage: 40 %
Battery Voltage: 2.87 V
Brady Statistic RV Percent Paced: 1 %
Date Time Interrogation Session: 20230710015026
HighPow Impedance: 73 Ohm
HighPow Impedance: 73 Ohm
Implantable Lead Implant Date: 20150925
Implantable Lead Location: 753860
Implantable Lead Model: 181
Implantable Lead Serial Number: 330358
Implantable Pulse Generator Implant Date: 20150925
Lead Channel Impedance Value: 360 Ohm
Lead Channel Pacing Threshold Amplitude: 1 V
Lead Channel Pacing Threshold Pulse Width: 0.5 ms
Lead Channel Sensing Intrinsic Amplitude: 7 mV
Lead Channel Setting Pacing Amplitude: 2.5 V
Lead Channel Setting Pacing Pulse Width: 0.5 ms
Lead Channel Setting Sensing Sensitivity: 0.5 mV
Pulse Gen Serial Number: 7228287

## 2021-10-24 NOTE — Progress Notes (Signed)
Remote ICD transmission.   

## 2021-11-08 ENCOUNTER — Other Ambulatory Visit (HOSPITAL_COMMUNITY): Payer: Self-pay | Admitting: Cardiology

## 2021-11-20 ENCOUNTER — Encounter (HOSPITAL_COMMUNITY): Payer: 59

## 2022-01-01 ENCOUNTER — Ambulatory Visit: Payer: 59

## 2022-01-11 ENCOUNTER — Other Ambulatory Visit (HOSPITAL_COMMUNITY): Payer: Self-pay

## 2022-01-11 ENCOUNTER — Other Ambulatory Visit (HOSPITAL_COMMUNITY): Payer: Self-pay | Admitting: Cardiology

## 2022-01-11 MED ORDER — CARVEDILOL 25 MG PO TABS: ORAL_TABLET | ORAL | 0 refills | Status: DC

## 2022-01-21 ENCOUNTER — Ambulatory Visit (INDEPENDENT_AMBULATORY_CARE_PROVIDER_SITE_OTHER): Payer: 59

## 2022-01-21 DIAGNOSIS — I428 Other cardiomyopathies: Secondary | ICD-10-CM

## 2022-01-22 LAB — CUP PACEART REMOTE DEVICE CHECK
Battery Remaining Longevity: 38 mo
Battery Remaining Percentage: 37 %
Battery Voltage: 2.87 V
Brady Statistic RV Percent Paced: 1 %
Date Time Interrogation Session: 20231021175301
HighPow Impedance: 61 Ohm
HighPow Impedance: 61 Ohm
Implantable Lead Connection Status: 753985
Implantable Lead Implant Date: 20150925
Implantable Lead Location: 753860
Implantable Lead Model: 181
Implantable Lead Serial Number: 330358
Implantable Pulse Generator Implant Date: 20150925
Lead Channel Impedance Value: 340 Ohm
Lead Channel Pacing Threshold Amplitude: 1 V
Lead Channel Pacing Threshold Pulse Width: 0.5 ms
Lead Channel Sensing Intrinsic Amplitude: 6.4 mV
Lead Channel Setting Pacing Amplitude: 2.5 V
Lead Channel Setting Pacing Pulse Width: 0.5 ms
Lead Channel Setting Sensing Sensitivity: 0.5 mV
Pulse Gen Serial Number: 7228287
Zone Setting Status: 755011

## 2022-02-06 ENCOUNTER — Other Ambulatory Visit (HOSPITAL_COMMUNITY): Payer: Self-pay | Admitting: Cardiology

## 2022-02-07 ENCOUNTER — Other Ambulatory Visit (HOSPITAL_COMMUNITY): Payer: Self-pay

## 2022-02-07 MED ORDER — CARVEDILOL 25 MG PO TABS: ORAL_TABLET | ORAL | 3 refills | Status: DC

## 2022-02-11 NOTE — Progress Notes (Signed)
Remote ICD transmission.   

## 2022-03-02 ENCOUNTER — Other Ambulatory Visit (HOSPITAL_COMMUNITY): Payer: Self-pay | Admitting: Cardiology

## 2022-03-13 ENCOUNTER — Other Ambulatory Visit: Payer: Self-pay | Admitting: *Deleted

## 2022-03-13 MED ORDER — FENOFIBRATE 48 MG PO TABS: ORAL_TABLET | ORAL | 11 refills | Status: DC

## 2022-04-07 ENCOUNTER — Other Ambulatory Visit (HOSPITAL_COMMUNITY): Payer: Self-pay | Admitting: Cardiology

## 2022-04-08 ENCOUNTER — Other Ambulatory Visit (HOSPITAL_COMMUNITY): Payer: Self-pay

## 2022-04-08 ENCOUNTER — Other Ambulatory Visit (HOSPITAL_COMMUNITY): Payer: Self-pay | Admitting: Cardiology

## 2022-04-08 DIAGNOSIS — I428 Other cardiomyopathies: Secondary | ICD-10-CM

## 2022-04-08 DIAGNOSIS — I5022 Chronic systolic (congestive) heart failure: Secondary | ICD-10-CM

## 2022-04-08 MED ORDER — ISOSORB DINITRATE-HYDRALAZINE 20-37.5 MG PO TABS: 0.5000 | ORAL_TABLET | Freq: Three times a day (TID) | ORAL | 1 refills | Status: DC

## 2022-04-22 ENCOUNTER — Ambulatory Visit: Payer: 59 | Attending: Internal Medicine

## 2022-05-12 ENCOUNTER — Other Ambulatory Visit (HOSPITAL_COMMUNITY): Payer: Self-pay | Admitting: Cardiology

## 2022-05-13 ENCOUNTER — Ambulatory Visit: Payer: 59

## 2022-05-13 DIAGNOSIS — I428 Other cardiomyopathies: Secondary | ICD-10-CM | POA: Diagnosis not present

## 2022-05-13 DIAGNOSIS — I5022 Chronic systolic (congestive) heart failure: Secondary | ICD-10-CM

## 2022-05-14 LAB — CUP PACEART REMOTE DEVICE CHECK
Battery Remaining Longevity: 34 mo
Battery Remaining Percentage: 33 %
Battery Voltage: 2.84 V
Brady Statistic RV Percent Paced: 1 %
Date Time Interrogation Session: 20240210212850
HighPow Impedance: 69 Ohm
HighPow Impedance: 69 Ohm
Implantable Lead Connection Status: 753985
Implantable Lead Implant Date: 20150925
Implantable Lead Location: 753860
Implantable Lead Model: 181
Implantable Lead Serial Number: 330358
Implantable Pulse Generator Implant Date: 20150925
Lead Channel Impedance Value: 350 Ohm
Lead Channel Pacing Threshold Amplitude: 1 V
Lead Channel Pacing Threshold Pulse Width: 0.5 ms
Lead Channel Sensing Intrinsic Amplitude: 6 mV
Lead Channel Setting Pacing Amplitude: 2.5 V
Lead Channel Setting Pacing Pulse Width: 0.5 ms
Lead Channel Setting Sensing Sensitivity: 0.5 mV
Pulse Gen Serial Number: 7228287
Zone Setting Status: 755011

## 2022-06-06 ENCOUNTER — Other Ambulatory Visit (HOSPITAL_COMMUNITY): Payer: Self-pay

## 2022-06-06 ENCOUNTER — Other Ambulatory Visit (HOSPITAL_COMMUNITY): Payer: Self-pay | Admitting: Cardiology

## 2022-06-06 DIAGNOSIS — I428 Other cardiomyopathies: Secondary | ICD-10-CM

## 2022-06-06 DIAGNOSIS — I5022 Chronic systolic (congestive) heart failure: Secondary | ICD-10-CM

## 2022-06-06 MED ORDER — SPIRONOLACTONE 25 MG PO TABS: ORAL_TABLET | ORAL | 2 refills | Status: DC

## 2022-06-23 ENCOUNTER — Other Ambulatory Visit (HOSPITAL_COMMUNITY): Payer: Self-pay | Admitting: Cardiology

## 2022-06-25 NOTE — Progress Notes (Signed)
Remote ICD transmission.   

## 2022-06-29 ENCOUNTER — Other Ambulatory Visit (HOSPITAL_COMMUNITY): Payer: Self-pay | Admitting: Cardiology

## 2022-07-01 ENCOUNTER — Other Ambulatory Visit (HOSPITAL_COMMUNITY): Payer: Self-pay

## 2022-07-01 MED ORDER — CARVEDILOL 25 MG PO TABS: ORAL_TABLET | ORAL | 0 refills | Status: DC

## 2022-07-22 ENCOUNTER — Ambulatory Visit: Payer: 59

## 2022-07-31 ENCOUNTER — Other Ambulatory Visit (HOSPITAL_COMMUNITY): Payer: Self-pay | Admitting: Cardiology

## 2022-08-05 ENCOUNTER — Other Ambulatory Visit (HOSPITAL_COMMUNITY): Payer: Self-pay | Admitting: Cardiology

## 2022-08-05 DIAGNOSIS — I5022 Chronic systolic (congestive) heart failure: Secondary | ICD-10-CM

## 2022-08-05 DIAGNOSIS — I428 Other cardiomyopathies: Secondary | ICD-10-CM

## 2022-08-12 ENCOUNTER — Ambulatory Visit (INDEPENDENT_AMBULATORY_CARE_PROVIDER_SITE_OTHER): Payer: 59

## 2022-08-12 DIAGNOSIS — I5022 Chronic systolic (congestive) heart failure: Secondary | ICD-10-CM

## 2022-08-12 DIAGNOSIS — I428 Other cardiomyopathies: Secondary | ICD-10-CM | POA: Diagnosis not present

## 2022-08-27 LAB — CUP PACEART REMOTE DEVICE CHECK
Battery Remaining Longevity: 30 mo
Battery Remaining Percentage: 30 %
Battery Voltage: 2.83 V
Brady Statistic RV Percent Paced: 1 %
Date Time Interrogation Session: 20240525050211
HighPow Impedance: 65 Ohm
HighPow Impedance: 65 Ohm
Implantable Lead Connection Status: 753985
Implantable Lead Implant Date: 20150925
Implantable Lead Location: 753860
Implantable Lead Model: 181
Implantable Lead Serial Number: 330358
Implantable Pulse Generator Implant Date: 20150925
Lead Channel Impedance Value: 360 Ohm
Lead Channel Pacing Threshold Amplitude: 1 V
Lead Channel Pacing Threshold Pulse Width: 0.5 ms
Lead Channel Sensing Intrinsic Amplitude: 6.7 mV
Lead Channel Setting Pacing Amplitude: 2.5 V
Lead Channel Setting Pacing Pulse Width: 0.5 ms
Lead Channel Setting Sensing Sensitivity: 0.5 mV
Pulse Gen Serial Number: 7228287
Zone Setting Status: 755011

## 2022-09-04 ENCOUNTER — Other Ambulatory Visit (HOSPITAL_COMMUNITY): Payer: Self-pay | Admitting: Cardiology

## 2022-09-04 ENCOUNTER — Telehealth (HOSPITAL_COMMUNITY): Payer: Self-pay

## 2022-09-04 DIAGNOSIS — I428 Other cardiomyopathies: Secondary | ICD-10-CM

## 2022-09-04 DIAGNOSIS — I5022 Chronic systolic (congestive) heart failure: Secondary | ICD-10-CM

## 2022-09-04 NOTE — Telephone Encounter (Signed)
Called and left patient a detailed message to call our office to get scheduled for a follow up appointment in our office.

## 2022-09-05 NOTE — Progress Notes (Signed)
Remote ICD transmission.   

## 2022-09-17 ENCOUNTER — Other Ambulatory Visit (HOSPITAL_COMMUNITY): Payer: Self-pay | Admitting: Cardiology

## 2022-10-18 ENCOUNTER — Telehealth (HOSPITAL_COMMUNITY): Payer: Self-pay

## 2022-10-18 NOTE — Telephone Encounter (Signed)
Called and left patient a voice message  to confirm/remind patient of their appointment at the [facility] on 10/21/22.   And to bring all medications and/or complete list.

## 2022-10-21 ENCOUNTER — Encounter (HOSPITAL_COMMUNITY): Payer: Self-pay

## 2022-10-21 ENCOUNTER — Ambulatory Visit (HOSPITAL_COMMUNITY)
Admission: RE | Admit: 2022-10-21 | Discharge: 2022-10-21 | Disposition: A | Discharge status: Home / Self Care | Payer: 59 | Source: Ambulatory Visit | Attending: Family Medicine | Admitting: Family Medicine

## 2022-10-21 VITALS — BP 136/84 | HR 68 | Wt 201.2 lb

## 2022-10-21 DIAGNOSIS — E785 Hyperlipidemia, unspecified: Secondary | ICD-10-CM | POA: Diagnosis not present

## 2022-10-21 DIAGNOSIS — I10 Essential (primary) hypertension: Secondary | ICD-10-CM

## 2022-10-21 DIAGNOSIS — I5022 Chronic systolic (congestive) heart failure: Secondary | ICD-10-CM | POA: Diagnosis not present

## 2022-10-21 DIAGNOSIS — Z79899 Other long term (current) drug therapy: Secondary | ICD-10-CM | POA: Diagnosis not present

## 2022-10-21 DIAGNOSIS — R0683 Snoring: Secondary | ICD-10-CM | POA: Insufficient documentation

## 2022-10-21 DIAGNOSIS — K769 Liver disease, unspecified: Secondary | ICD-10-CM

## 2022-10-21 DIAGNOSIS — I428 Other cardiomyopathies: Secondary | ICD-10-CM | POA: Diagnosis not present

## 2022-10-21 DIAGNOSIS — Z7984 Long term (current) use of oral hypoglycemic drugs: Secondary | ICD-10-CM | POA: Insufficient documentation

## 2022-10-21 DIAGNOSIS — K7689 Other specified diseases of liver: Secondary | ICD-10-CM | POA: Diagnosis not present

## 2022-10-21 DIAGNOSIS — I130 Hypertensive heart and chronic kidney disease with heart failure and stage 1 through stage 4 chronic kidney disease, or unspecified chronic kidney disease: Secondary | ICD-10-CM | POA: Insufficient documentation

## 2022-10-21 DIAGNOSIS — N183 Chronic kidney disease, stage 3 unspecified: Secondary | ICD-10-CM | POA: Insufficient documentation

## 2022-10-21 DIAGNOSIS — R202 Paresthesia of skin: Secondary | ICD-10-CM

## 2022-10-21 DIAGNOSIS — Z95810 Presence of automatic (implantable) cardiac defibrillator: Secondary | ICD-10-CM | POA: Diagnosis not present

## 2022-10-21 LAB — COMPREHENSIVE METABOLIC PANEL
ALT: 19 U/L (ref 0–44)
AST: 17 U/L (ref 15–41)
Albumin: 4.3 g/dL (ref 3.5–5.0)
Alkaline Phosphatase: 42 U/L (ref 38–126)
Anion gap: 6 (ref 5–15)
BUN: 25 mg/dL — ABNORMAL HIGH (ref 8–23)
CO2: 22 mmol/L (ref 22–32)
Calcium: 9.3 mg/dL (ref 8.9–10.3)
Chloride: 112 mmol/L — ABNORMAL HIGH (ref 98–111)
Creatinine, Ser: 2.04 mg/dL — ABNORMAL HIGH (ref 0.61–1.24)
GFR, Estimated: 36 mL/min — ABNORMAL LOW (ref >60)
Glucose, Bld: 104 mg/dL — ABNORMAL HIGH (ref 70–99)
Potassium: 4.6 mmol/L (ref 3.5–5.1)
Sodium: 140 mmol/L (ref 135–145)
Total Bilirubin: 0.5 mg/dL (ref 0.3–1.2)
Total Protein: 7 g/dL (ref 6.5–8.1)

## 2022-10-21 LAB — BRAIN NATRIURETIC PEPTIDE: B Natriuretic Peptide: 21 pg/mL (ref 0.0–100.0)

## 2022-10-21 LAB — VITAMIN B12: Vitamin B-12: 233 pg/mL (ref 180–914)

## 2022-10-21 LAB — LIPID PANEL
Cholesterol: 143 mg/dL (ref 0–200)
HDL: 23 mg/dL — ABNORMAL LOW (ref >40)
LDL Cholesterol: 91 mg/dL (ref 0–99)
Total CHOL/HDL Ratio: 6.2 RATIO
Triglycerides: 143 mg/dL (ref <150)
VLDL: 29 mg/dL (ref 0–40)

## 2022-10-21 LAB — HEMOGLOBIN A1C
Hgb A1c MFr Bld: 5.1 % (ref 4.8–5.6)
Mean Plasma Glucose: 99.67 mg/dL

## 2022-10-21 NOTE — Patient Instructions (Signed)
Thank you for coming in today  If you had labs drawn today, any labs that are abnormal the clinic will call you No news is good news  You were provided a Primary care physician list  Medications: No changes   Follow up appointments:  Your physician recommends that you schedule a follow-up appointment in:  4 months With Dr. Earlean Shawl will receive a reminder letter in the mail a few months in advance. If you don't receive a letter, please call our office to schedule the follow-up appointment.    Do the following things EVERYDAY: Weigh yourself in the morning before breakfast. Write it down and keep it in a log. Take your medicines as prescribed Eat low salt foods--Limit salt (sodium) to 2000 mg per day.  Stay as active as you can everyday Limit all fluids for the day to less than 2 liters   At the [facility], you and your health needs are our priority. As part of our continuing mission to provide you with exceptional heart care, we have created designated Provider Care Teams. These Care Teams include your primary Cardiologist (physician) and Advanced Practice Providers (APPs- Physician Assistants and Nurse Practitioners) who all work together to provide you with the care you need, when you need it.   You may see any of the following providers on your designated Care Team at your next follow up: Dr Arvilla Meres Dr Marca Ancona Dr. Marcos Eke, NP Robbie Lis, [place] [facility] [place], [place] Brynda Peon, NP Karle Plumber, PharmD   Please be sure to bring in all your medications bottles to every appointment.    Thank you for choosing [facility]  If you have any questions or concerns before your next appointment please send [place] a message through [place] or call our office at [phone].    TO LEAVE A MESSAGE FOR THE NURSE SELECT OPTION 2, PLEASE LEAVE A MESSAGE INCLUDING: YOUR  NAME DATE OF BIRTH CALL BACK NUMBER REASON FOR CALL**this is important as we prioritize the call backs  YOU WILL RECEIVE A CALL BACK THE SAME DAY AS LONG AS YOU CALL BEFORE 4:00 PM

## 2022-10-21 NOTE — Progress Notes (Signed)
Patient ID: Edward Greene, male   DOB: 09-28-1960, 62 y.o.   MRN: [id number]  Cardiology: Dr. Shirlee Latch  62 y.o. with minimal past history presented initially after hospitalization with acute systolic CHF.  In 9/12, patient began to notice dyspnea with heavy exertion, such as when he carried something up a flight of steps. He also was having difficulty sleeping with symptoms that sounded consistent with orthopnea.  No chest pain.  He went to urgent care for evaluation, and CXR showed pulmonary edema.  BNP was elevated and ECG was abnormal.  He was sent to the ER.  Evaluation showed CHF with volume overload.  Echo showed EF 15% with moderate to severe LV dilation and multiple wall motion abnormalities.  Cardiac enzymes were negative.  Patient was diuresed and started on cardiac meds.  Left heart cath in 9/12 showed no angiographic CAD.  Cardiac MRI (9/12) showed EF 15%, moderate to severe LV dilation, mild RV dysfunction, and no delayed enhancement.  Cardiopulmonary exercise test, however, showed no significant functional limitation with VO2 max 33.6 and normal VE/VCO2 slope.  Echo 3/13 showed EF 25-30%.  Last echo in 1/15 showed EF remained 25-30% with moderate LV dilation and global hypokinesis.  There were prominent apical trabeculations concerning for possilbe noncompaction, RV was normal.  CPX was repeated in 2/15.  Peak VO2 was good at 26 but lower than prior.  VE/VCO2 slope 27 with RER 1.26.  He was seen by Dr. Graciela Husbands and had [facility] ICD placed.    Echo in 3/19 showed increase in EF to 55-60%, significant improvement.  Echo in 10/21 showed EF 40-45% with ?noncompaction, normal RV.  Echo 4/23 showed EF 40-45%, prominent trabeculations, RV normal, IVC normal.   Today he returns for HF follow up. Overall feeling fine. He has occasional dizziness with standing too quickly, no falls. 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 He is not SOB with activity. Denies palpitations, CP,  edema, or  PND/Orthopnea. Appetite ok. No fever or chills. Weight at home 197 pounds. Taking all medications. He snores. Main complaint is bilateral hand tingling, worse at night. Started about 3 weeks ago.  St Jude device interrogation: stable thoracic impedance, no VT, <15 Vpacing, ~ 6hr/day activity  ECG (personally reviewed): NSR 72 bpm, anterior TWIs (similar to previous prior)  Labs (9/12): K 4.3, creatinine 1.7 => 1.6, pro-BNP 907=>218, SPEP negative, HIV negative,  Labs (3/19): TGs 360, LDL 67 Labs (4/19): K 4.3, creatinine 1.73 Labs (7/19): K 3.9, creatinine 1.78, LDL 75 Labs (10/20): LDL 107 Labs (1/21): K 3.8, creatinine 1.88 Labs (10/22): K 4.3, creatinine 1.73, BNP 7.4, LDL 71, TGs 300 Labs 94/23): LDL 99 Labs (5/23): K 4.4, creatinine 2.22  PMH: 1. Childhood asthma 2. Hyperlipidemia 3. Systolic CHF: Echo (9/12) with EF 15%, moderate-severe LV dilation, inferior/posterior/inferoseptal/apical akinesis, septal/anterolateral severe hypokinesis, mild to moderate RV dilation with normal RV systolic function.  HIV negative, SPEP negative.  No history of ETOH or drugs.  No history of HTN.  LHC (9/12) with no angiographic CAD. Possible viral myocarditis versus familial cardiomyopathy.  Cardiac MRI (9/12) with EF 15%, moderate to severe LV dilation, mild RV dysfunction, no myocardial delayed enhancement.  Echo (1/13): EF 15%, diffuse HK, grade I diastolic dysfunction.  CPX (1/13): VO2 max 33.6, VE/VCO2 normal.  Echo (3/13): EF 25-30%, global hypokinesis, normal RV.  Echo (1/15): EF 25-30%, moderate LV dilation, global hypokinesis, prominent apical trabeculations, normal RV.  CPX (2/15): RER 1.26, peak  VO2 26, VE/VCO2 27.  - St Jude ICD - Echo (3/19) with EF 55-60%.   - Echo (10/21): EF 40-45% with ?noncompaction, normal RV. - Echo (4/23): EF 40-45%, prominent trabeculations, RV normal, IVC normal.  4. ACEI cough 5. CKD stage III 6. H/o NSVT  FH: Father with CHF in his 62s, thinks he had an MI.   Father has PCM.  Father had 9 brothers, all had MIs or CHF.  Sister with MI in her 69s.   SH: Lives in [place] with his wife.  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  No tobacco, no ETOH, no no drugs.   ROS: All systems reviewed and negative except as per HPI.   Current Outpatient Medications  Medication Sig Dispense Refill   atorvastatin (LIPITOR) 20 MG tablet TAKE 1 TABLET BY MOUTH EVERY DAY. NEEDS FOLLOW UP APPOINTMENT FOR MORE REFILLS 60 tablet 0   carvedilol (COREG) 25 MG tablet Take 1 tablet by mouth twice a day. 60 tablet 0   fenofibrate (TRICOR) 48 MG tablet TAKE 1 TABLET(48 MG) BY MOUTH DAILY 60 tablet 11   ibuprofen (ADVIL) 200 MG tablet Take 200 mg by mouth every 6 (six) hours as needed.     icosapent Ethyl (VASCEPA) 1 g capsule Take 2 capsules (2 g total) by mouth 2 (two) times daily. 120 capsule 11   isosorbide-hydrALAZINE (BIDIL) 20-37.5 MG tablet TAKE 1/2 TABLET BY MOUTH THREE TIMES DAILY. PLEASE MAKE AN APPOINTMENT FOR FURTHER REFILLS 60 tablet 0   JARDIANCE 10 MG TABS tablet TAKE 1 TABLET(10 MG) BY MOUTH DAILY BEFORE BREAKFAST 30 tablet 0   sacubitril-valsartan (ENTRESTO) 49-51 MG TAKE 1 TABLET BY MOUTH TWICE DAILY 60 tablet 6   spironolactone (ALDACTONE) 25 MG tablet TAKE 1 TABLET(25 MG) BY MOUTH DAILY, NEEDS FOLLOW UP APPOINTMENT FOR MORE REFILLS 60 tablet 0   No current facility-administered medications for this encounter.   Wt Readings from Last 3 Encounters:  10/21/22 91.3 kg (201 lb 3.2 oz)  07/26/21 88 kg (194 lb)  01/02/21 90.8 kg (200 lb 3.2 oz)   BP 136/84   Pulse 68   Wt 91.3 kg (201 lb 3.2 oz)   SpO2 96%   BMI 31.51 kg/m  Physical Exam General:  NAD. No resp difficulty, walked into clinic HEENT: Normal Neck: Supple. No JVD. Carotids 2+ bilat; no bruits. No lymphadenopathy or thryomegaly appreciated. Cor: PMI nondisplaced. Regular rate & rhythm. No rubs, gallops or murmurs. Lungs: Clear Abdomen: Soft, nontender,  nondistended. No hepatosplenomegaly. No bruits or masses. Good bowel sounds. Extremities: No cyanosis, clubbing, rash, edema Neuro: Alert & oriented x 3, cranial nerves grossly intact. Moves all 4 extremities w/o difficulty. Affect pleasant.  Assessment/Plan: 1. Chronic systolic CHF: Nonischemic cardiomyopathy.  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  EF was 25-30% echo in 1/15.  He has a [employer] school [employer] ICD. CPX in 2/15 showed overall normal functional capacity but he did not do as well as in 2013.  Echo in 3/19 showed improvement in EF back to normal range, 55-60%, but echo in 10/20 showed EF down a bit to 40-45%. Echo 5/23 showed EF 40-45% with prominent trabeculations.   NYHA class I symptoms.  He is not volume overloaded on exam.   - No Lasix needed at this time.  - Continue Coreg 25 mg bid. - Continue spironolactone 25 mg daily. BMET, BNP today. - Continue Entresto 49/51 bid.   - Continue Bidil 1/2 tab tid, has  not had morning meds yet, will not increase GDMT today. - Continue Jardiance 10 mg daily.  - Update echo 2. Hyperlipidemia: Continue atorvastatin and fenofibrate, check lipids.  3. HTN: BP controlled.  - GDMT as above 4. CKD: Stage 3.   - He is on SGLT2 inhibitor  5. Liver lesion: 2.5 x 3.2 liver nodule noted incidentally on echo.   - CT previously arranged to evaluate but this was not completed. - As above, will repeat echo. If nodule present, will proceed with CT   6. Snoring: discussed home sleep study today, he would like to this about this. 7. Hand paresthesia: Bilateral grip strength 5/5. Symptoms worse at night, Phalen maneuver negative. ? Carpal tunnel vs mineral deficiency. - Check labs, hgb A1C and Vit B12. Needs PCP for further work up. Given list of PCPs today to establish with.  Follow up in 4 months with Dr. Park Liter, FNP-BC 10/21/2022

## 2022-10-31 ENCOUNTER — Ambulatory Visit (HOSPITAL_COMMUNITY)
Admission: RE | Admit: 2022-10-31 | Discharge: 2022-10-31 | Disposition: A | Discharge status: Home / Self Care | Payer: 59 | Source: Ambulatory Visit | Attending: Cardiology | Admitting: Cardiology

## 2022-10-31 ENCOUNTER — Other Ambulatory Visit (HOSPITAL_COMMUNITY): Payer: Self-pay | Admitting: Cardiology

## 2022-10-31 DIAGNOSIS — I110 Hypertensive heart disease with heart failure: Secondary | ICD-10-CM | POA: Diagnosis not present

## 2022-10-31 DIAGNOSIS — I509 Heart failure, unspecified: Secondary | ICD-10-CM | POA: Diagnosis not present

## 2022-10-31 DIAGNOSIS — I351 Nonrheumatic aortic (valve) insufficiency: Secondary | ICD-10-CM | POA: Diagnosis not present

## 2022-10-31 DIAGNOSIS — I5022 Chronic systolic (congestive) heart failure: Secondary | ICD-10-CM | POA: Diagnosis not present

## 2022-10-31 LAB — ECHOCARDIOGRAM COMPLETE
AR max vel: 2.76 cm2
AV Area VTI: 2.59 cm2
AV Area mean vel: 2.57 cm2
AV Mean grad: 3 mmHg
AV Peak grad: 4.4 mmHg
Ao pk vel: 1.05 m/s
Area-P 1/2: 4.52 cm2
S' Lateral: 4.3 cm

## 2022-10-31 MED ORDER — PERFLUTREN LIPID MICROSPHERE
1.0000 mL | INTRAVENOUS | Status: AC | PRN
  Administered 2022-10-31: 2 mL via INTRAVENOUS

## 2022-11-04 ENCOUNTER — Telehealth (HOSPITAL_COMMUNITY): Payer: Self-pay

## 2022-11-04 NOTE — Telephone Encounter (Addendum)
Pt aware, agreeable, and verbalized understanding   ----- Message from Marca Ancona sent at 10/31/2022 10:27 AM EDT ----- EF 40-45%, stable.

## 2022-11-08 ENCOUNTER — Encounter: Payer: Self-pay | Admitting: Cardiology

## 2022-11-11 ENCOUNTER — Ambulatory Visit: Payer: 59

## 2022-11-13 ENCOUNTER — Other Ambulatory Visit (HOSPITAL_COMMUNITY): Payer: Self-pay

## 2022-11-13 DIAGNOSIS — I5022 Chronic systolic (congestive) heart failure: Secondary | ICD-10-CM

## 2022-11-13 DIAGNOSIS — I428 Other cardiomyopathies: Secondary | ICD-10-CM

## 2022-11-13 MED ORDER — ISOSORB DINITRATE-HYDRALAZINE 20-37.5 MG PO TABS
0.5000 | ORAL_TABLET | Freq: Three times a day (TID) | ORAL | 6 refills | Status: DC
Start: 2022-11-13 — End: 2023-07-30

## 2022-12-03 ENCOUNTER — Ambulatory Visit (INDEPENDENT_AMBULATORY_CARE_PROVIDER_SITE_OTHER): Payer: 59

## 2022-12-03 DIAGNOSIS — I5022 Chronic systolic (congestive) heart failure: Secondary | ICD-10-CM | POA: Diagnosis not present

## 2022-12-03 LAB — CUP PACEART REMOTE DEVICE CHECK
Battery Remaining Longevity: 28 mo
Battery Remaining Percentage: 27 %
Battery Voltage: 2.81 V
Brady Statistic RV Percent Paced: 1 %
Date Time Interrogation Session: 20240901133713
HighPow Impedance: 70 Ohm
HighPow Impedance: 70 Ohm
Implantable Lead Connection Status: 753985
Implantable Lead Implant Date: 20150925
Implantable Lead Location: 753860
Implantable Lead Model: 181
Implantable Lead Serial Number: 330358
Implantable Pulse Generator Implant Date: 20150925
Lead Channel Impedance Value: 340 Ohm
Lead Channel Pacing Threshold Amplitude: 1 V
Lead Channel Pacing Threshold Pulse Width: 0.5 ms
Lead Channel Sensing Intrinsic Amplitude: 7.4 mV
Lead Channel Setting Pacing Amplitude: 2.5 V
Lead Channel Setting Pacing Pulse Width: 0.5 ms
Lead Channel Setting Sensing Sensitivity: 0.5 mV
Pulse Gen Serial Number: 7228287
Zone Setting Status: 755011

## 2022-12-11 NOTE — Progress Notes (Signed)
Remote ICD transmission.   

## 2023-02-07 ENCOUNTER — Other Ambulatory Visit (HOSPITAL_COMMUNITY): Payer: Self-pay

## 2023-02-07 MED ORDER — ATORVASTATIN CALCIUM 20 MG PO TABS: 20.0000 mg | ORAL_TABLET | Freq: Every day | ORAL | 3 refills | Status: AC

## 2023-02-07 MED ORDER — SPIRONOLACTONE 25 MG PO TABS: 25.0000 mg | ORAL_TABLET | Freq: Every day | ORAL | 3 refills | Status: DC

## 2023-02-10 ENCOUNTER — Ambulatory Visit: Payer: 59

## 2023-03-04 ENCOUNTER — Ambulatory Visit: Payer: 59

## 2023-04-03 ENCOUNTER — Other Ambulatory Visit: Payer: Self-pay

## 2023-04-03 MED ORDER — FENOFIBRATE 48 MG PO TABS: ORAL_TABLET | ORAL | 11 refills | Status: AC

## 2023-04-03 NOTE — Telephone Encounter (Signed)
 This is a CHF pt

## 2023-05-12 ENCOUNTER — Ambulatory Visit: Payer: 59

## 2023-06-03 ENCOUNTER — Ambulatory Visit: Payer: 59

## 2023-06-17 ENCOUNTER — Ambulatory Visit (INDEPENDENT_AMBULATORY_CARE_PROVIDER_SITE_OTHER)

## 2023-06-17 DIAGNOSIS — I428 Other cardiomyopathies: Secondary | ICD-10-CM | POA: Diagnosis not present

## 2023-06-18 LAB — CUP PACEART REMOTE DEVICE CHECK
Battery Remaining Longevity: 22 mo
Battery Remaining Percentage: 22 %
Battery Voltage: 2.78 V
Brady Statistic RV Percent Paced: 1 %
Date Time Interrogation Session: 20250318044805
HighPow Impedance: 70 Ohm
HighPow Impedance: 70 Ohm
Lead Channel Impedance Value: 340 Ohm
Lead Channel Pacing Threshold Amplitude: 1 V
Lead Channel Pacing Threshold Pulse Width: 0.5 ms
Lead Channel Sensing Intrinsic Amplitude: 6.9 mV
Lead Channel Setting Pacing Amplitude: 2.5 V
Lead Channel Setting Pacing Pulse Width: 0.5 ms
Lead Channel Setting Sensing Sensitivity: 0.5 mV
Pulse Gen Serial Number: 7228287
Zone Setting Status: 755011

## 2023-07-07 ENCOUNTER — Telehealth (HOSPITAL_COMMUNITY): Payer: Self-pay

## 2023-07-07 ENCOUNTER — Other Ambulatory Visit (HOSPITAL_COMMUNITY): Payer: Self-pay

## 2023-07-07 NOTE — Telephone Encounter (Signed)
 Advanced [facility] Patient Advocate Encounter  Prior authorization for Sherryll Burger has been submitted and approved. Test billing returns $50 for 30 day supply. This plan limits 30 day supply.  Key: [id number] Effective: 07/06/2024 to 07/06/2024  Burnell Blanks, CPhT Rx Patient Advocate Phone: [phone]

## 2023-07-11 ENCOUNTER — Telehealth (HOSPITAL_COMMUNITY): Payer: Self-pay | Admitting: Cardiology

## 2023-07-11 ENCOUNTER — Other Ambulatory Visit (HOSPITAL_COMMUNITY): Payer: Self-pay

## 2023-07-11 MED ORDER — CARVEDILOL 25 MG PO TABS: ORAL_TABLET | ORAL | 0 refills | Status: DC

## 2023-07-11 NOTE — Telephone Encounter (Signed)
 Front office left patient a message to schedule overdue follow up appt with Dr. Shirlee Latch / [facility]. Instructed patient to call front office staff back to get scheduled.   Front office personnel received message from [place], [place] to schedule patient a follow up appt.

## 2023-07-14 ENCOUNTER — Other Ambulatory Visit (HOSPITAL_COMMUNITY): Payer: Self-pay | Admitting: Cardiology

## 2023-07-29 ENCOUNTER — Telehealth (HOSPITAL_COMMUNITY): Payer: Self-pay | Admitting: *Deleted

## 2023-07-29 NOTE — Telephone Encounter (Signed)
 Called to confirm/remind patient of their appointment at the [facility] on 07/04/23***.   Appointment:   [x] Confirmed  [] Left mess   [] No answer/No voice mail  [] Phone not in service  Patient reminded to bring all medications and/or complete list.  Confirmed patient has transportation. Gave directions, instructed to utilize valet parking.

## 2023-07-30 ENCOUNTER — Ambulatory Visit (HOSPITAL_COMMUNITY)
Admission: RE | Admit: 2023-07-30 | Discharge: 2023-07-30 | Disposition: A | Discharge status: Home / Self Care | Source: Ambulatory Visit | Attending: Family Medicine | Admitting: Family Medicine

## 2023-07-30 ENCOUNTER — Encounter (HOSPITAL_COMMUNITY): Payer: Self-pay

## 2023-07-30 VITALS — BP 94/70 | HR 70 | Wt 193.0 lb

## 2023-07-30 DIAGNOSIS — N183 Chronic kidney disease, stage 3 unspecified: Secondary | ICD-10-CM | POA: Insufficient documentation

## 2023-07-30 DIAGNOSIS — I428 Other cardiomyopathies: Secondary | ICD-10-CM | POA: Diagnosis not present

## 2023-07-30 DIAGNOSIS — I130 Hypertensive heart and chronic kidney disease with heart failure and stage 1 through stage 4 chronic kidney disease, or unspecified chronic kidney disease: Secondary | ICD-10-CM | POA: Insufficient documentation

## 2023-07-30 DIAGNOSIS — Z7984 Long term (current) use of oral hypoglycemic drugs: Secondary | ICD-10-CM | POA: Insufficient documentation

## 2023-07-30 DIAGNOSIS — E785 Hyperlipidemia, unspecified: Secondary | ICD-10-CM | POA: Diagnosis not present

## 2023-07-30 DIAGNOSIS — R202 Paresthesia of skin: Secondary | ICD-10-CM | POA: Insufficient documentation

## 2023-07-30 DIAGNOSIS — K7689 Other specified diseases of liver: Secondary | ICD-10-CM | POA: Diagnosis not present

## 2023-07-30 DIAGNOSIS — R0683 Snoring: Secondary | ICD-10-CM | POA: Insufficient documentation

## 2023-07-30 DIAGNOSIS — I10 Essential (primary) hypertension: Secondary | ICD-10-CM | POA: Diagnosis not present

## 2023-07-30 DIAGNOSIS — I5022 Chronic systolic (congestive) heart failure: Secondary | ICD-10-CM | POA: Diagnosis present

## 2023-07-30 DIAGNOSIS — Z139 Encounter for screening, unspecified: Secondary | ICD-10-CM

## 2023-07-30 DIAGNOSIS — Z95810 Presence of automatic (implantable) cardiac defibrillator: Secondary | ICD-10-CM | POA: Insufficient documentation

## 2023-07-30 DIAGNOSIS — Z79899 Other long term (current) drug therapy: Secondary | ICD-10-CM | POA: Diagnosis not present

## 2023-07-30 DIAGNOSIS — K769 Liver disease, unspecified: Secondary | ICD-10-CM

## 2023-07-30 LAB — COMPREHENSIVE METABOLIC PANEL WITH GFR
ALT: 13 U/L (ref 0–44)
AST: 15 U/L (ref 15–41)
Albumin: 4.2 g/dL (ref 3.5–5.0)
Alkaline Phosphatase: 44 U/L (ref 38–126)
Anion gap: 10 (ref 5–15)
BUN: 15 mg/dL (ref 8–23)
CO2: 23 mmol/L (ref 22–32)
Calcium: 9.7 mg/dL (ref 8.9–10.3)
Chloride: 107 mmol/L (ref 98–111)
Creatinine, Ser: 1.71 mg/dL — ABNORMAL HIGH (ref 0.61–1.24)
GFR, Estimated: 44 mL/min — ABNORMAL LOW (ref >60)
Glucose, Bld: 104 mg/dL — ABNORMAL HIGH (ref 70–99)
Potassium: 4.3 mmol/L (ref 3.5–5.1)
Sodium: 140 mmol/L (ref 135–145)
Total Bilirubin: 0.8 mg/dL (ref 0.0–1.2)
Total Protein: 6.7 g/dL (ref 6.5–8.1)

## 2023-07-30 LAB — LIPID PANEL
Cholesterol: 152 mg/dL (ref 0–200)
HDL: 27 mg/dL — ABNORMAL LOW (ref >40)
LDL Cholesterol: 94 mg/dL (ref 0–99)
Total CHOL/HDL Ratio: 5.6 ratio
Triglycerides: 154 mg/dL — ABNORMAL HIGH (ref <150)
VLDL: 31 mg/dL (ref 0–40)

## 2023-07-30 LAB — CBC
HCT: 43.8 % (ref 39.0–52.0)
Hemoglobin: 13.5 g/dL (ref 13.0–17.0)
MCH: 28.2 pg (ref 26.0–34.0)
MCHC: 30.8 g/dL (ref 30.0–36.0)
MCV: 91.6 fL (ref 80.0–100.0)
Platelets: 174 10*3/uL (ref 150–400)
RBC: 4.78 MIL/uL (ref 4.22–5.81)
RDW: 12.6 % (ref 11.5–15.5)
WBC: 4.8 10*3/uL (ref 4.0–10.5)
nRBC: 0 % (ref 0.0–0.2)

## 2023-07-30 LAB — BRAIN NATRIURETIC PEPTIDE: B Natriuretic Peptide: 10 pg/mL (ref 0.0–100.0)

## 2023-07-30 NOTE — Patient Instructions (Signed)
 Medication Changes:  No Changes In Medications at this time.   Lab Work:  Labs done today, your results will be available in MyChart, we will contact you for abnormal readings.  PRIMARY CARE LIST GIVEN- PLEASE REVIEW   Follow-Up in: 4 MONTHS WITH ECHO  PLEASE CALL OUR OFFICE AROUND JUNE TO GET SCHEDULED FOR YOUR APPOINTMENT. PHONE NUMBER IS [phone] OPTION 2   At the [facility], you and your health needs are our priority. We have a designated team specialized in the treatment of Heart Failure. This Care Team includes your primary Heart Failure Specialized Cardiologist (physician), Advanced Practice Providers (APPs- Physician Assistants and Nurse Practitioners), and Pharmacist who all work together to provide you with the care you need, when you need it.   You may see any of the following providers on your designated Care Team at your next follow up:  Dr. Jules Oar Dr. Peder Bourdon Dr. Alwin Baars Dr. Judyth Nunnery Nieves Bars, NP Ruddy Corral, [place] [facility] [place], [place] Dennise Fitz, NP [place] Lee, NP Luster Salters, PharmD   Please be sure to bring in all your medications bottles to every appointment.   Need to Contact Us :  If you have any questions or concerns before your next appointment please send us  a message through [place] or call our office at [phone].    TO LEAVE A MESSAGE FOR THE NURSE SELECT OPTION 2, PLEASE LEAVE A MESSAGE INCLUDING: YOUR NAME DATE OF BIRTH CALL BACK NUMBER REASON FOR CALL**this is important as we prioritize the call backs  YOU WILL RECEIVE A CALL BACK THE SAME DAY AS LONG AS YOU CALL BEFORE 4:00 PM

## 2023-07-30 NOTE — Progress Notes (Signed)
 Patient ID: Edward Greene, male   DOB: November 29, 1960, 63 y.o.   MRN: [id number]  Cardiology: Dr. Mitzie Anda  63 y.o. with minimal past history presented initially after hospitalization with acute systolic CHF.  In 9/12, patient began to notice dyspnea with heavy exertion, such as when he carried something up a flight of steps. He also was having difficulty sleeping with symptoms that sounded consistent with orthopnea.  No chest pain.  He went to urgent care for evaluation, and CXR showed pulmonary edema.  BNP was elevated and ECG was abnormal.  He was sent to the ER.  Evaluation showed CHF with volume overload.  Echo showed EF 15% with moderate to severe LV dilation and multiple wall motion abnormalities.  Cardiac enzymes were negative.  Patient was diuresed and started on cardiac meds.  Left heart cath in 9/12 showed no angiographic CAD.  Cardiac MRI (9/12) showed EF 15%, moderate to severe LV dilation, mild RV dysfunction, and no delayed enhancement.  Cardiopulmonary exercise test, however, showed no significant functional limitation with VO2 max 33.6 and normal VE/VCO2 slope.  Echo 3/13 showed EF 25-30%.  Last echo in 1/15 showed EF remained 25-30% with moderate LV dilation and global hypokinesis.  There were prominent apical trabeculations concerning for possilbe noncompaction, RV was normal.  CPX was repeated in 2/15.  Peak VO2 was good at 26 but lower than prior.  VE/VCO2 slope 27 with RER 1.26.  He was seen by Dr. Rodolfo Clan and had [facility] ICD placed.    Echo in 3/19 showed increase in EF to 55-60%, significant improvement.  Echo in 10/21 showed EF 40-45% with ?noncompaction, normal RV.  Echo 4/23 showed EF 40-45%, prominent trabeculations, RV normal, IVC normal.   Last seen 09/2022.  Echo 8/24 showed EF 40-45%, normal RV  Today he returns for HF follow up. Overall feeling fine. Riding bike outside for exercise, boxes but no SOB with this. Occasional positional dizziness, no falls. Denies palpitations,  abnormal bleeding, CP, edema, or PND/Orthopnea. Appetite ok.   Weight at home 192-193 pounds. Taking all medications. Separated from wife x 4 months. Has tingling in fingers. No tobacco/ETOH or drug use. 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 Continues with bilateral hand tingling, no PCP yet.  St Jude device interrogation: stable thoracic impedance, < 1% VP, no VT  ECG (personally reviewed): NSR 68 bpm  Labs (9/12): K 4.3, creatinine 1.7 => 1.6, pro-BNP 907=>218, SPEP negative, HIV negative,  Labs (7/24): K 4.6, creatinine 2.04, LDL 91  PMH: 1. Childhood asthma 2. Hyperlipidemia 3. Systolic CHF: Echo (9/12) with EF 15%, moderate-severe LV dilation, inferior/posterior/inferoseptal/apical akinesis, septal/anterolateral severe hypokinesis, mild to moderate RV dilation with normal RV systolic function.  HIV negative, SPEP negative.  No history of ETOH or drugs.  No history of HTN.  LHC (9/12) with no angiographic CAD. Possible viral myocarditis versus familial cardiomyopathy.  Cardiac MRI (9/12) with EF 15%, moderate to severe LV dilation, mild RV dysfunction, no myocardial delayed enhancement.  Echo (1/13): EF 15%, diffuse HK, grade I diastolic dysfunction.  CPX (1/13): VO2 max 33.6, VE/VCO2 normal.  Echo (3/13): EF 25-30%, global hypokinesis, normal RV.  Echo (1/15): EF 25-30%, moderate LV dilation, global hypokinesis, prominent apical trabeculations, normal RV.  CPX (2/15): RER 1.26, peak VO2 26, VE/VCO2 27.  - St Jude ICD - Echo (3/19) with EF 55-60%.   - Echo (10/21): EF 40-45% with ?noncompaction, normal RV. - Echo (4/23): EF 40-45%, prominent trabeculations, RV normal, IVC  normal.  - Echo (8/24): EF 40-45%, normal RV 4. ACEI cough 5. CKD stage III 6. H/o NSVT  FH: Father with CHF in his 8s, thinks he had an MI.  Father has PCM.  Father had 9 brothers, all had MIs or CHF.  Sister with MI in her 49s.   SH: Lives in [place] with his wife.  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  No tobacco, no ETOH, no no drugs.   ROS: All systems reviewed and negative except as per HPI.   Current Outpatient Medications  Medication Sig Dispense Refill   atorvastatin  (LIPITOR) 20 MG tablet Take 1 tablet (20 mg total) by mouth daily. 90 tablet 3   carvedilol  (COREG ) 25 MG tablet Take 1 tablet by mouth twice a day. 60 tablet 0   empagliflozin  (JARDIANCE ) 10 MG TABS tablet Take 1 tablet (10 mg total) by mouth daily. NEEDS FOLLOW UP APPOINTMENT FOR MORE REFILLS 30 tablet 0   fenofibrate  (TRICOR ) 48 MG tablet TAKE 1 TABLET(48 MG) BY MOUTH DAILY 60 tablet 11   isosorbide -hydrALAZINE  (BIDIL ) 20-37.5 MG tablet Take 0.5 tablets by mouth 3 (three) times daily.     sacubitril -valsartan  (ENTRESTO ) 49-51 MG Take 1 tablet by mouth 2 (two) times daily. NEEDS FOLLOW UP APPOINTMENT FOR MORE REFILLS 60 tablet 0   spironolactone  (ALDACTONE ) 25 MG tablet Take 1 tablet (25 mg total) by mouth daily. 90 tablet 3   No current facility-administered medications for this encounter.   Wt Readings from Last 3 Encounters:  07/30/23 87.5 kg (193 lb)  10/21/22 91.3 kg (201 lb 3.2 oz)  07/26/21 88 kg (194 lb)   BP 94/70   Pulse 70   Wt 87.5 kg (193 lb)   SpO2 96%   BMI 30.23 kg/m   Physical Exam General:  NAD. No resp difficulty HEENT: Normal Neck: Supple. No JVD. Cor: Regular rate & rhythm. No rubs, gallops or murmurs. Lungs: Clear Abdomen: Soft, nontender, nondistended.  Extremities: No cyanosis, clubbing, rash, edema Neuro: Alert & oriented x 3, moves all 4 extremities w/o difficulty. Affect pleasant.  Assessment/Plan: 1. Chronic systolic CHF: Nonischemic cardiomyopathy.  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  EF was 25-30% echo in 1/15.  He has a [employer] school [employer] ICD. CPX in 2/15 showed overall normal functional capacity but he did not do as well as in 2013.  Echo in 3/19 showed improvement in EF back to normal range,  55-60%, but echo in 10/20 showed EF down a bit to 40-45%. Echo 5/23 showed EF 40-45% with prominent trabeculations.  Echo 8/24 EF 40-45%, normal RV.  NYHA class I symptoms.  He is not volume overloaded on exam.   - No Lasix needed at this time.  - Continue Coreg  25 mg bid. - Continue spironolactone  25 mg daily. BMET/BNP today. - Continue Entresto  49/51 bid.   - Continue Bidil  1/2 tab tid  - Continue Jardiance  10 mg daily.  - Update echo next visit. 2. Hyperlipidemia: Continue atorvastatin  and fenofibrate , check lipids.  3. HTN: BP controlled.  - GDMT as above 4. CKD: Stage 3.   - He is on SGLT2 inhibitor. BMET today. 5. Liver lesion: 2.5 x 3.2 liver nodule noted incidentally on echo.   - CT previously arranged to evaluate but this was not completed. We discussed this again today, he will think about it. 6. Snoring: discussed home sleep study today, he declines. 7. SDOH: given list of PCPs today to establish  care.  Follow up in 4 months with Dr. Mitzie Anda.  Arlice Bene Fraser, FNP-BC 07/30/2023

## 2023-08-03 NOTE — Progress Notes (Signed)
 Remote ICD transmission.

## 2023-08-03 NOTE — Addendum Note (Signed)
 Addended by: Lott Rouleau A on: 08/03/2023 04:54 AM   Modules accepted: Orders

## 2023-08-11 ENCOUNTER — Ambulatory Visit: Payer: 59

## 2023-08-14 ENCOUNTER — Encounter: Payer: Self-pay | Admitting: Family Medicine

## 2023-08-28 ENCOUNTER — Other Ambulatory Visit (HOSPITAL_COMMUNITY): Payer: Self-pay | Admitting: Cardiology

## 2023-09-02 ENCOUNTER — Ambulatory Visit: Payer: 59

## 2023-09-16 ENCOUNTER — Ambulatory Visit (INDEPENDENT_AMBULATORY_CARE_PROVIDER_SITE_OTHER)

## 2023-09-16 DIAGNOSIS — I5022 Chronic systolic (congestive) heart failure: Secondary | ICD-10-CM | POA: Diagnosis not present

## 2023-09-22 ENCOUNTER — Ambulatory Visit: Payer: Self-pay | Admitting: Cardiology

## 2023-09-22 LAB — CUP PACEART REMOTE DEVICE CHECK
Battery Remaining Longevity: 18 mo
Battery Remaining Percentage: 17 %
Battery Voltage: 2.75 V
Brady Statistic RV Percent Paced: 1 %
Date Time Interrogation Session: 20250621131826
HighPow Impedance: 65 Ohm
HighPow Impedance: 65 Ohm
Lead Channel Impedance Value: 340 Ohm
Lead Channel Pacing Threshold Amplitude: 1 V
Lead Channel Pacing Threshold Pulse Width: 0.5 ms
Lead Channel Sensing Intrinsic Amplitude: 7.2 mV
Lead Channel Setting Pacing Amplitude: 2.5 V
Lead Channel Setting Pacing Pulse Width: 0.5 ms
Lead Channel Setting Sensing Sensitivity: 0.5 mV
Pulse Gen Serial Number: 7228287
Zone Setting Status: 755011

## 2023-10-08 ENCOUNTER — Other Ambulatory Visit (HOSPITAL_COMMUNITY): Payer: Self-pay | Admitting: Cardiology

## 2023-10-28 ENCOUNTER — Other Ambulatory Visit (HOSPITAL_COMMUNITY): Payer: Self-pay

## 2023-10-28 MED ORDER — EMPAGLIFLOZIN 10 MG PO TABS: 10.0000 mg | ORAL_TABLET | Freq: Every day | ORAL | 1 refills | Status: AC

## 2023-10-29 ENCOUNTER — Other Ambulatory Visit (HOSPITAL_COMMUNITY): Payer: Self-pay | Admitting: Cardiology

## 2023-11-27 NOTE — Progress Notes (Signed)
 Remote ICD transmission.

## 2023-11-27 NOTE — Addendum Note (Signed)
 Addended by: VICCI SELLER A on: 11/27/2023 01:48 PM   Modules accepted: Orders

## 2023-12-02 ENCOUNTER — Ambulatory Visit: Payer: 59

## 2023-12-12 ENCOUNTER — Telehealth (HOSPITAL_COMMUNITY): Payer: Self-pay | Admitting: Cardiology

## 2023-12-16 ENCOUNTER — Ambulatory Visit

## 2023-12-24 ENCOUNTER — Ambulatory Visit

## 2023-12-24 DIAGNOSIS — I5022 Chronic systolic (congestive) heart failure: Secondary | ICD-10-CM

## 2023-12-25 LAB — CUP PACEART REMOTE DEVICE CHECK
Battery Remaining Longevity: 14 mo
Battery Remaining Percentage: 14 %
Battery Voltage: 2.72 V
Brady Statistic RV Percent Paced: 1 %
Date Time Interrogation Session: 20250924095332
HighPow Impedance: 69 Ohm
HighPow Impedance: 69 Ohm
Lead Channel Impedance Value: 340 Ohm
Lead Channel Pacing Threshold Amplitude: 1 V
Lead Channel Pacing Threshold Pulse Width: 0.5 ms
Lead Channel Sensing Intrinsic Amplitude: 7.8 mV
Lead Channel Setting Pacing Amplitude: 2.5 V
Lead Channel Setting Pacing Pulse Width: 0.5 ms
Lead Channel Setting Sensing Sensitivity: 0.5 mV
Pulse Gen Serial Number: 7228287
Zone Setting Status: 755011

## 2023-12-26 ENCOUNTER — Ambulatory Visit: Payer: Self-pay | Admitting: Cardiology

## 2023-12-26 NOTE — Progress Notes (Signed)
Remote ICD Transmission.

## 2023-12-28 ENCOUNTER — Other Ambulatory Visit (HOSPITAL_COMMUNITY): Payer: Self-pay | Admitting: Cardiology

## 2024-01-29 ENCOUNTER — Ambulatory Visit: Admitting: Cardiology

## 2024-03-01 ENCOUNTER — Other Ambulatory Visit (HOSPITAL_COMMUNITY): Payer: Self-pay

## 2024-03-01 MED ORDER — SPIRONOLACTONE 25 MG PO TABS: 25.0000 mg | ORAL_TABLET | Freq: Every day | ORAL | 0 refills | Status: AC

## 2024-03-02 ENCOUNTER — Ambulatory Visit: Payer: 59

## 2024-03-16 ENCOUNTER — Ambulatory Visit

## 2024-03-25 ENCOUNTER — Ambulatory Visit

## 2024-03-25 DIAGNOSIS — I5022 Chronic systolic (congestive) heart failure: Secondary | ICD-10-CM

## 2024-03-26 LAB — CUP PACEART REMOTE DEVICE CHECK
Battery Remaining Longevity: 12 mo
Battery Remaining Percentage: 11 %
Battery Voltage: 2.69 V
Brady Statistic RV Percent Paced: 1 %
Date Time Interrogation Session: 20251225020019
HighPow Impedance: 66 Ohm
HighPow Impedance: 66 Ohm
Lead Channel Impedance Value: 330 Ohm
Lead Channel Pacing Threshold Amplitude: 1 V
Lead Channel Pacing Threshold Pulse Width: 0.5 ms
Lead Channel Sensing Intrinsic Amplitude: 6.6 mV
Lead Channel Setting Pacing Amplitude: 2.5 V
Lead Channel Setting Pacing Pulse Width: 0.5 ms
Lead Channel Setting Sensing Sensitivity: 0.5 mV
Pulse Gen Serial Number: 7228287
Zone Setting Status: 755011

## 2024-03-26 NOTE — Progress Notes (Signed)
 Remote ICD Transmission

## 2024-03-31 ENCOUNTER — Ambulatory Visit: Payer: Self-pay | Admitting: Cardiovascular Disease

## 2024-06-01 ENCOUNTER — Ambulatory Visit: Payer: 59

## 2024-06-15 ENCOUNTER — Ambulatory Visit

## 2024-06-23 ENCOUNTER — Ambulatory Visit

## 2024-06-24 ENCOUNTER — Ambulatory Visit

## 2024-08-31 ENCOUNTER — Ambulatory Visit: Payer: 59

## 2024-09-14 ENCOUNTER — Ambulatory Visit

## 2024-09-22 ENCOUNTER — Ambulatory Visit

## 2024-09-23 ENCOUNTER — Ambulatory Visit

## 2024-11-30 ENCOUNTER — Ambulatory Visit: Payer: 59

## 2024-12-14 ENCOUNTER — Ambulatory Visit

## 2024-12-22 ENCOUNTER — Ambulatory Visit

## 2024-12-23 ENCOUNTER — Ambulatory Visit

## 2025-03-01 ENCOUNTER — Ambulatory Visit: Payer: 59

## 2025-03-15 ENCOUNTER — Ambulatory Visit

## 2025-05-31 ENCOUNTER — Ambulatory Visit: Payer: 59
